# Patient Record
Sex: Female | Born: 1990 | Race: Black or African American | Hispanic: No | Marital: Single | Smoking: Current some day smoker
Health system: Southern US, Community
[De-identification: ages and names within clinical notes are randomized; demographics above are authoritative.]

## PROBLEM LIST (undated history)

## (undated) ENCOUNTER — Ambulatory Visit (HOSPITAL_COMMUNITY): Payer: Managed Care, Other (non HMO)

## (undated) DIAGNOSIS — F32A Depression, unspecified: Secondary | ICD-10-CM

## (undated) DIAGNOSIS — D369 Benign neoplasm, unspecified site: Secondary | ICD-10-CM

## (undated) DIAGNOSIS — F329 Major depressive disorder, single episode, unspecified: Secondary | ICD-10-CM

## (undated) HISTORY — PX: BREAST REDUCTION SURGERY: SHX8

## (undated) HISTORY — DX: Benign neoplasm, unspecified site: D36.9

---

## 2011-11-18 ENCOUNTER — Emergency Department (HOSPITAL_COMMUNITY)
Admission: EM | Admit: 2011-11-18 | Discharge: 2011-11-18 | Disposition: A | Discharge status: Home / Self Care | Payer: Self-pay | Attending: Emergency Medicine | Admitting: Emergency Medicine

## 2011-11-18 ENCOUNTER — Encounter (HOSPITAL_COMMUNITY): Payer: Self-pay | Admitting: *Deleted

## 2011-11-18 DIAGNOSIS — R1084 Generalized abdominal pain: Secondary | ICD-10-CM | POA: Insufficient documentation

## 2011-11-18 DIAGNOSIS — N390 Urinary tract infection, site not specified: Secondary | ICD-10-CM | POA: Insufficient documentation

## 2011-11-18 DIAGNOSIS — N72 Inflammatory disease of cervix uteri: Secondary | ICD-10-CM | POA: Insufficient documentation

## 2011-11-18 LAB — WET PREP, GENITAL: Yeast Wet Prep HPF POC: NONE SEEN

## 2011-11-18 LAB — CBC
HCT: 33.3 % — ABNORMAL LOW (ref 36.0–46.0)
MCH: 29.7 pg (ref 26.0–34.0)
MCV: 87.4 fL (ref 78.0–100.0)
RDW: 12.7 % (ref 11.5–15.5)
WBC: 8.1 10*3/uL (ref 4.0–10.5)

## 2011-11-18 LAB — URINE MICROSCOPIC-ADD ON

## 2011-11-18 LAB — URINALYSIS, ROUTINE W REFLEX MICROSCOPIC
Nitrite: NEGATIVE
Specific Gravity, Urine: 1.032 — ABNORMAL HIGH (ref 1.005–1.030)
Urobilinogen, UA: 4 mg/dL — ABNORMAL HIGH (ref 0.0–1.0)
pH: 7 (ref 5.0–8.0)

## 2011-11-18 LAB — COMPREHENSIVE METABOLIC PANEL
Albumin: 4.3 g/dL (ref 3.5–5.2)
BUN: 11 mg/dL (ref 6–23)
Calcium: 9.5 mg/dL (ref 8.4–10.5)
Chloride: 102 mEq/L (ref 96–112)
Creatinine, Ser: 0.74 mg/dL (ref 0.50–1.10)
GFR calc non Af Amer: >90 mL/min (ref >90)
Total Bilirubin: 0.2 mg/dL — ABNORMAL LOW (ref 0.3–1.2)

## 2011-11-18 LAB — PREGNANCY, URINE: Preg Test, Ur: NEGATIVE

## 2011-11-18 LAB — LIPASE, BLOOD: Lipase: 30 U/L (ref 11–59)

## 2011-11-18 MED ORDER — SODIUM CHLORIDE 0.9 % IV BOLUS (SEPSIS)
1000.0000 mL | Freq: Once | INTRAVENOUS | Status: AC
  Administered 2011-11-18: 1000 mL via INTRAVENOUS

## 2011-11-18 MED ORDER — CEFTRIAXONE SODIUM 250 MG IJ SOLR
250.0000 mg | Freq: Once | INTRAMUSCULAR | Status: AC
  Administered 2011-11-18: 250 mg via INTRAMUSCULAR
  Filled 2011-11-18: qty 250

## 2011-11-18 MED ORDER — LIDOCAINE HCL 1 % IJ SOLN
INTRAMUSCULAR | Status: AC
  Administered 2011-11-18: 1 mL
  Filled 2011-11-18: qty 20

## 2011-11-18 MED ORDER — NITROFURANTOIN MONOHYD MACRO 100 MG PO CAPS: 100.0000 mg | ORAL_CAPSULE | Freq: Two times a day (BID) | ORAL | Status: AC

## 2011-11-18 MED ORDER — AZITHROMYCIN 250 MG PO TABS
1000.0000 mg | ORAL_TABLET | Freq: Once | ORAL | Status: AC
  Administered 2011-11-18: 1000 mg via ORAL
  Filled 2011-11-18: qty 4

## 2011-11-18 NOTE — ED Notes (Addendum)
Pt in c/o intermittent abd pain over last 2 months, states she has not had a period in two months, neg preg test at home, denies n/v, denies any other symptoms

## 2011-11-18 NOTE — ED Provider Notes (Signed)
History     CSN: [id number]  Arrival date & time 11/18/11  1452   First MD Initiated Contact with Patient 11/18/11 1745      Chief Complaint  Patient presents with  . Abdominal Pain    (Consider location/radiation/quality/duration/timing/severity/associated sxs/prior treatment) The history is provided by the patient.   patient is a healthy 21 year old female who presents with a chief complaint of waxing and waning generalized abdominal pain for the last 2 months. There has been an associated amenorrhea as her last menstrual period was at the end of January (though she does not know the exact date). She is not on any form of birth control and does not use condoms with her husband, her reported monogamous sexual partner. There is some vaginal discharge, though she feels is normal for her. There is no vaginal pain or itching. Of note, the patient had a breast reduction November and did not have a period in November or December either. She denies any fever, chills, nausea, vomiting, diarrhea, constipation, dysuria, hematuria. Eating sometimes makes her symptoms worse but not always. Nothing makes her symptoms better, though she has not tried anything for the pain. She has no prior abdominal surgeries.  History reviewed. No pertinent past medical history.  History reviewed. No pertinent past surgical history.  History reviewed. No pertinent family history.  History  Substance Use Topics  . Smoking status: Not on file  . Smokeless tobacco: Not on file  . Alcohol Use: Not on file     Review of Systems 10 systems reviewed and are negative for acute change except as noted in the HPI.  Allergies  Review of patient's allergies indicates no known allergies.  Home Medications  No current outpatient prescriptions on file.  BP 118/66  Pulse 102  Temp(Src) 98.5 F (36.9 C) (Oral)  Resp 20  SpO2 100%  Physical Exam  Nursing note and vitals reviewed. Constitutional: She is oriented to  person, place, and time. She appears well-developed and well-nourished. No distress.  HENT:  Head: Normocephalic and atraumatic.  Right Ear: External ear normal.  Left Ear: External ear normal.  Mouth/Throat: Oropharynx is clear and moist.  Eyes: Conjunctivae are normal. Pupils are equal, round, and reactive to light.  Neck: Normal range of motion. Neck supple.  Cardiovascular: Normal rate, regular rhythm and normal heart sounds.   Pulmonary/Chest: Effort normal and breath sounds normal. No respiratory distress. She exhibits no tenderness.  Abdominal: Soft. Bowel sounds are normal. There is Tenderness: mild generalized pain on palpation.. There is no rebound and no guarding.       No rigidity  Genitourinary: There is no rash, tenderness or lesion on the right labia. There is no rash, tenderness or lesion on the left labia. Uterus is not enlarged and not tender. Cervix exhibits discharge and friability. Cervix exhibits no motion tenderness. Right adnexum displays no mass, no tenderness and no fullness. Left adnexum displays no mass, no tenderness and no fullness. No tenderness or bleeding around the vagina. No foreign body around the vagina. Vaginal discharge found.       Yellow vaginal discharge  Musculoskeletal: She exhibits no edema and no tenderness.  Neurological: She is alert and oriented to person, place, and time. No cranial nerve deficit.  Skin: Skin is warm and dry. No rash noted.  Psychiatric: She has a normal mood and affect.    ED Course  Procedures (including critical care time)  Labs Reviewed  URINALYSIS, ROUTINE W REFLEX MICROSCOPIC - Abnormal; Notable  for the following:    Specific Gravity, Urine 1.032 (*)    Bilirubin Urine SMALL (*)    Urobilinogen, UA 4.0 (*)    Leukocytes, UA LARGE (*)    All other components within normal limits  URINE MICROSCOPIC-ADD ON - Abnormal; Notable for the following:    Squamous Epithelial / LPF MANY (*)    Bacteria, UA MANY (*)    All  other components within normal limits  CBC - Abnormal; Notable for the following:    RBC 3.81 (*)    Hemoglobin 11.3 (*)    HCT 33.3 (*)    All other components within normal limits  COMPREHENSIVE METABOLIC PANEL - Abnormal; Notable for the following:    Glucose, Bld 118 (*)    Total Bilirubin 0.2 (*)    All other components within normal limits  WET PREP, GENITAL - Abnormal; Notable for the following:    WBC, Wet Prep HPF POC MANY (*)    All other components within normal limits  PREGNANCY, URINE  LIPASE, BLOOD  GC/CHLAMYDIA PROBE AMP, GENITAL   No results found.   1. UTI (urinary tract infection)   2. Cervicitis       MDM  Keimya 2 months of abdominal pain with a relatively benign abdomen on examination. Although there is elevated urobilinogen in her urine, her total bilirubin is not elevated on her CMP so I suspect an erroneous result in her urine. There is evidence of a likely urinary tract infection and she'll be treated for this. In addition, her wet prep demonstrates many white blood cells in the setting of a friable cervix and yellow vaginal discharge. She'll be treated for gonorrhea and chlamydia in the emergency department and I have discussed these diseases with her as well as the lesser precaution she should take. Patient voices understanding of the plan.        Shaaron Adler, [place] 11/18/11 2039

## 2011-11-18 NOTE — Discharge Instructions (Signed)
Urinary Tract Infection Infections of the urinary tract can start in several places. A bladder infection (cystitis), a kidney infection (pyelonephritis), and a prostate infection (prostatitis) are different types of urinary tract infections (UTIs). They usually get better if treated with medicines (antibiotics) that kill germs. Take all the medicine until it is gone. You or your child may feel better in a few days, but TAKE ALL MEDICINE or the infection may not respond and may become more difficult to treat. HOME CARE INSTRUCTIONS   Drink enough water and fluids to keep the urine clear or pale yellow. Cranberry juice is especially recommended, in addition to large amounts of water.   Avoid caffeine, tea, and carbonated beverages. They tend to irritate the bladder.   Alcohol may irritate the prostate.   Only take over-the-counter or prescription medicines for pain, discomfort, or fever as directed by your caregiver.  To prevent further infections:  Empty the bladder often. Avoid holding urine for long periods of time.   After a bowel movement, women should cleanse from front to back. Use each tissue only once.   Empty the bladder before and after sexual intercourse.  FINDING OUT THE RESULTS OF YOUR TEST Not all test results are available during your visit. If your or your child's test results are not back during the visit, make an appointment with your caregiver to find out the results. Do not assume everything is normal if you have not heard from your caregiver or the medical facility. It is important for you to follow up on all test results. SEEK MEDICAL CARE IF:   There is back pain.   Your baby is older than 3 months with a rectal temperature of 100.5 F (38.1 C) or higher for more than 1 day.   Your or your child's problems (symptoms) are no better in 3 days. Return sooner if you or your child is getting worse.  SEEK IMMEDIATE MEDICAL CARE IF:   There is severe back pain or lower  abdominal pain.   You or your child develops chills.   You have a fever.   Your baby is older than 3 months with a rectal temperature of 102 F (38.9 C) or higher.   Your baby is 95 months old or younger with a rectal temperature of 100.4 F (38 C) or higher.   There is nausea or vomiting.   There is continued burning or discomfort with urination.  MAKE SURE YOU:   Understand these instructions.   Will watch your condition.   Will get help right away if you are not doing well or get worse.  Document Released: 05/26/2005 Document Revised: 08/05/2011 Document Reviewed: 12/29/2006 [facility] Patient Information 2012 [place], [place].        Cervicitis Cervicitis is a soreness and swelling (inflammation) of the cervix. Your cervix is located at the bottom of your uterus which opens up to the vagina.  CAUSES   Sexually transmitted infections (STIs).   Allergic reaction.   Medicines or birth control devices that are put in the vagina.   Injury to the cervix.   Bacterial infections.  SYMPTOMS  There may be no symptoms. If symptoms occur, they may include:  Grey, white, yellow, or bad smelling vaginal discharge.   Pain or itching of the area outside the vagina.   Painful sexual intercourse.   Lower abdominal or lower back pain, especially during intercourse.   Frequent urination.   Abnormal vaginal bleeding between periods, after sexual intercourse, or after menopause.  Pressure or a heavy feeling in the pelvis.  DIAGNOSIS  Diagnosis is made after a pelvic exam. Other tests may include:  Examination of any discharge under a microscope (wet prep).   A Pap test.  TREATMENT  Treatment will depend on the cause of cervicitis. If it is caused by an STI, both you and your partner will need to be treated. Antibiotic medicines will be given. HOME CARE INSTRUCTIONS   Do not have sexual intercourse until your caregiver says it is okay.   Do not have sexual  intercourse until your partner has been treated if your cervicitis is caused by an STI.   Take your antibiotics as directed. Finish them even if you start to feel better.  SEEK IMMEDIATE MEDICAL CARE IF:   Your symptoms come back.   You have a fever.   You experience any problems that may be related to the medicine you are taking.  MAKE SURE YOU:   Understand these instructions.   Will watch your condition.   Will get help right away if you are not doing well or get worse.  Document Released: 08/16/2005 Document Revised: 08/05/2011 Document Reviewed: 03/15/2011 [facility] Patient Information 2012 [place], [place].       Free STD Testing These locations offer FREE, confidential testing for HIV, chlamydia, gonorrhea, and syphilis: [facility]: [address] [place]    [phone]    Mondays from 5pm-7pm [facility]: [address] [place], [address]    Self Help Building    [phone]    Wednesdays from 2pm-8pm [facility] and [facility]: [address]    [phone]    Thursdays from 9am-12pm and 1pm-4pm [facility]: [address]    [phone]    Tuesdays from 9am-12pm, Thursdays from 1pm-4pm   [facility] offers FREE, confidential testing and treatment for HIV, chlamydia, gonorrhea, syphilis, herpes, bacterial vaginosis, yeast, and trichomoniasis:  Call [phone] for an appointment at either location, testing is Monday through Friday at both locations  [facility]: [address] [place] [facility]: [address]

## 2011-11-20 NOTE — ED Notes (Signed)
attempted to call patient. Number is no longer in service. Will send letter.

## 2011-11-20 NOTE — ED Notes (Signed)
+  Chlamydia. Patient treated with Rocephin and Zithromax. 

## 2011-11-20 NOTE — ED Provider Notes (Signed)
History/physical exam/procedure(s) were performed by non-physician practitioner and as supervising physician I was immediately available for consultation/collaboration. I have reviewed all notes and am in agreement with care and plan.   Deasia Chiu S Xavion Muscat, MD 11/20/11 0652 

## 2012-07-04 ENCOUNTER — Encounter (HOSPITAL_COMMUNITY): Payer: Self-pay | Admitting: Physical Medicine and Rehabilitation

## 2012-07-04 ENCOUNTER — Emergency Department (HOSPITAL_COMMUNITY)
Admission: EM | Admit: 2012-07-04 | Discharge: 2012-07-05 | Disposition: A | Discharge status: Home / Self Care | Payer: Self-pay | Attending: Emergency Medicine | Admitting: Emergency Medicine

## 2012-07-04 DIAGNOSIS — N939 Abnormal uterine and vaginal bleeding, unspecified: Secondary | ICD-10-CM

## 2012-07-04 DIAGNOSIS — R109 Unspecified abdominal pain: Secondary | ICD-10-CM | POA: Insufficient documentation

## 2012-07-04 DIAGNOSIS — O9989 Other specified diseases and conditions complicating pregnancy, childbirth and the puerperium: Secondary | ICD-10-CM | POA: Insufficient documentation

## 2012-07-04 DIAGNOSIS — N898 Other specified noninflammatory disorders of vagina: Secondary | ICD-10-CM

## 2012-07-04 DIAGNOSIS — Z3490 Encounter for supervision of normal pregnancy, unspecified, unspecified trimester: Secondary | ICD-10-CM

## 2012-07-04 DIAGNOSIS — O209 Hemorrhage in early pregnancy, unspecified: Secondary | ICD-10-CM | POA: Insufficient documentation

## 2012-07-04 LAB — CBC
MCHC: 33.6 g/dL (ref 30.0–36.0)
RDW: 12.8 % (ref 11.5–15.5)

## 2012-07-04 LAB — URINE MICROSCOPIC-ADD ON

## 2012-07-04 LAB — WET PREP, GENITAL
Trich, Wet Prep: NONE SEEN
Yeast Wet Prep HPF POC: NONE SEEN

## 2012-07-04 LAB — URINALYSIS, ROUTINE W REFLEX MICROSCOPIC
Glucose, UA: NEGATIVE mg/dL
Hgb urine dipstick: NEGATIVE
Specific Gravity, Urine: 1.036 — ABNORMAL HIGH (ref 1.005–1.030)

## 2012-07-04 LAB — TYPE AND SCREEN
ABO/RH(D): O POS
Antibody Screen: NEGATIVE

## 2012-07-04 LAB — POCT PREGNANCY, URINE: Preg Test, Ur: POSITIVE — AB

## 2012-07-04 MED ORDER — CEFTRIAXONE SODIUM 250 MG IJ SOLR
250.0000 mg | Freq: Once | INTRAMUSCULAR | Status: AC
  Administered 2012-07-04: 250 mg via INTRAMUSCULAR
  Filled 2012-07-04: qty 250

## 2012-07-04 MED ORDER — AZITHROMYCIN 1 G PO PACK
1.0000 g | PACK | Freq: Once | ORAL | Status: AC
  Administered 2012-07-04: 1 g via ORAL
  Filled 2012-07-04: qty 1

## 2012-07-04 NOTE — ED Notes (Signed)
Pt presents to department for evaluation of vaginal bleeding. State she had positive pregnancy test last week. LMP: 03/09/12. 5/10 lower abdominal pain at the time. Also states nausea. States she has not received prenatal care. She is conscious alert and oriented x4.

## 2012-07-04 NOTE — ED Provider Notes (Signed)
History     CSN: [id number]  Arrival date & time 07/04/12  1533   First MD Initiated Contact with Patient 07/04/12 2015      Chief Complaint  Patient presents with  . Vaginal Bleeding    (Consider location/radiation/quality/duration/timing/severity/associated sxs/prior treatment) HPIAngela Teagle is a 21 y.o. female who is presenting for evaluation of vaginal bleeding. Complaint was that she had some small amount of vaginal bleeding this morning and has not returned. She's had some left-sided abdominal pain this feels like a moderate sharp and a pressure type pain, she also is having some frequency but not like a urinary tract infection. She's had no dysuria or urgency. She knows she is pregnant. Last menstrual period was 710 or 03/09/2012. She's been having some nausea in a car.  Patient says her "Medicaid he is out" so she's not had any prenatal care so far is not taking any vitamins.  No past medical history on file.  No past surgical history on file.  No family history on file.  History  Substance Use Topics  . Smoking status: Never Smoker   . Smokeless tobacco: Not on file  . Alcohol Use: No    OB History    Grav Para Term Preterm Abortions TAB SAB Ect Mult Living                  Review of Systems At least 10pt or greater review of systems completed and are negative except where specified in the HPI.  Allergies  Review of patient's allergies indicates no known allergies.  Home Medications  No current outpatient prescriptions on file.  BP 96/60  Pulse 73  Temp 98.4 F (36.9 C) (Oral)  Resp 18  SpO2 99%  Physical Exam  Nursing notes reviewed.  Electronic medical record reviewed. VITAL SIGNS:   Filed Vitals:   07/04/12 1551 07/04/12 2027 07/04/12 2257  BP: 100/62 96/60 94/57   Pulse: 83 73 77  Temp: 98.5 F (36.9 C) 98.4 F (36.9 C) 98.2 F (36.8 C)  TempSrc: Oral Oral Oral  Resp: 18 18 20   SpO2: 99% 99% 100%   CONSTITUTIONAL: Awake, oriented,  appears non-toxic HENT: Atraumatic, normocephalic, oral mucosa pink and moist, airway patent. Nares patent without drainage. External ears normal. EYES: Conjunctiva clear, EOMI, PERRLA NECK: Trachea midline, non-tender, supple CARDIOVASCULAR: Normal heart rate, Normal rhythm, No murmurs, rubs, gallops PULMONARY/CHEST: Clear to auscultation, no rhonchi, wheezes, or rales. Symmetrical breath sounds. Non-tender. ABDOMINAL: Non-distended, soft, non-tender - no rebound or guarding. Gravid uterus approximately 3 cm beneath the umbilicus.  BS normal. NEUROLOGIC: Non-focal, moving all four extremities, no gross sensory or motor deficits. EXTREMITIES: No clubbing, cyanosis, or edema SKIN: Warm, Dry, No erythema, No rash Pelvic exam: normal external genitalia, vulva, vagina has moderated quantity of green purulent mucoid material in vault, cervix red and friable - os closed, no blood seen, uterus and adnexa palpate as gravid w/o CMT   ED Course  Procedures (including critical care time)  Labs Reviewed  URINALYSIS, ROUTINE W REFLEX MICROSCOPIC - Abnormal; Notable for the following:    Color, Urine AMBER (*)  BIOCHEMICALS MAY BE AFFECTED BY COLOR   APPearance CLOUDY (*)     Specific Gravity, Urine 1.036 (*)     Bilirubin Urine SMALL (*)     Ketones, ur 15 (*)     Urobilinogen, UA 4.0 (*)     Leukocytes, UA SMALL (*)     All other components within normal limits  POCT PREGNANCY, URINE -  Abnormal; Notable for the following:    Preg Test, Ur POSITIVE (*)     All other components within normal limits  URINE MICROSCOPIC-ADD ON - Abnormal; Notable for the following:    Squamous Epithelial / LPF MANY (*)     Bacteria, UA FEW (*)     All other components within normal limits  URINE CULTURE  WET PREP, GENITAL  GC/CHLAMYDIA PROBE AMP, URINE   No results found. No results found.    1. Purulent vaginal discharge   2. Pregnancy   3. Abdominal pain   4. Vaginal bleeding       MDM  Laelle Bridgett is a 21 y.o. female presenting for lower abdominal pain, known pregnancy. Pregnancy is 16 and 6 by last menstrual period. She had an isolated incident of bleeding this morning, this is gone, and did not recur. Pelvic exam shows a large amount of purulence material in the vaginal vault - consistent with PID.  We'll treat the patient. Patient says she's had this before and prior pregnancies and was considered "normal." Based on the color and amount of this discharge, do not think this is normal I think he does reflect a cervicitis/STI especially with some cervical friability consistent with cervicitis.  My bedside ultrasound shows a healthy intrauterine pregnancy heart rate approximately 145.    Treat patient for STI/cervicitis, Tylenol for pain medicine. The bleeding was likely caused by the friable cervix.  Advised OB/GYN followup at the [facility], and that the husband should get checked as well for STI.  I explained the diagnosis and have given explicit precautions to return to the ER including worsening abdominal pain, bright red blood per vagina or any other new or worsening symptoms. The patient understands and accepts the medical plan as it's been dictated and I have answered their questions. Discharge instructions concerning home care and prescriptions have been given.  The patient is STABLE and is discharged to home in good condition.          Jones Skene, MD 07/07/12 1541

## 2012-07-05 LAB — URINE CULTURE

## 2012-07-07 LAB — GC/CHLAMYDIA PROBE AMP
CT Probe RNA: POSITIVE — AB
GC Probe RNA: NEGATIVE

## 2012-09-23 ENCOUNTER — Encounter (HOSPITAL_COMMUNITY): Payer: Self-pay | Admitting: Emergency Medicine

## 2012-09-23 ENCOUNTER — Emergency Department (HOSPITAL_COMMUNITY)
Admission: EM | Admit: 2012-09-23 | Discharge: 2012-09-24 | Disposition: A | Discharge status: Home / Self Care | Payer: Medicaid Other | Attending: Emergency Medicine | Admitting: Emergency Medicine

## 2012-09-23 DIAGNOSIS — O9989 Other specified diseases and conditions complicating pregnancy, childbirth and the puerperium: Secondary | ICD-10-CM | POA: Insufficient documentation

## 2012-09-23 DIAGNOSIS — Z8742 Personal history of other diseases of the female genital tract: Secondary | ICD-10-CM

## 2012-09-23 DIAGNOSIS — Z3490 Encounter for supervision of normal pregnancy, unspecified, unspecified trimester: Secondary | ICD-10-CM

## 2012-09-23 DIAGNOSIS — N949 Unspecified condition associated with female genital organs and menstrual cycle: Secondary | ICD-10-CM | POA: Insufficient documentation

## 2012-09-23 DIAGNOSIS — R109 Unspecified abdominal pain: Secondary | ICD-10-CM

## 2012-09-23 DIAGNOSIS — O4690 Antepartum hemorrhage, unspecified, unspecified trimester: Secondary | ICD-10-CM | POA: Insufficient documentation

## 2012-09-23 LAB — WET PREP, GENITAL
Trich, Wet Prep: NONE SEEN
Yeast Wet Prep HPF POC: NONE SEEN

## 2012-09-23 LAB — CBC WITH DIFFERENTIAL/PLATELET
Basophils Absolute: 0 10*3/uL (ref 0.0–0.1)
Eosinophils Absolute: 0.1 10*3/uL (ref 0.0–0.7)
Eosinophils Relative: 1 % (ref 0–5)
HCT: 27.5 % — ABNORMAL LOW (ref 36.0–46.0)
Lymphocytes Relative: 20 % (ref 12–46)
MCH: 30 pg (ref 26.0–34.0)
MCV: 87.9 fL (ref 78.0–100.0)
Monocytes Absolute: 0.7 10*3/uL (ref 0.1–1.0)
RDW: 12.8 % (ref 11.5–15.5)
WBC: 7.7 10*3/uL (ref 4.0–10.5)

## 2012-09-23 NOTE — ED Notes (Signed)
[facility]  and [facility] is not having any contractions and heartrate of150 for baby

## 2012-09-23 NOTE — Progress Notes (Signed)
Received call from [facility] Emergency Department RN with patient complaining of abdominal pain and vaginal bleeding.

## 2012-09-23 NOTE — ED Provider Notes (Signed)
History  This chart was scribed for Kim Skene, MD by Ardeen Jourdain, ED Scribe. This patient was seen in room APA01/APA01 and the patient's care was started at 2256.  CSN: [id number]  Arrival date & time 09/23/12  2225   First MD Initiated Contact with Patient 09/23/12 2256      Chief Complaint  Patient presents with  . Abdominal Pain     The history is provided by the patient. No language interpreter was used.    Kim Zimmerman is a 22 y.o. female G3P2 who presents to the Emergency Department complaining of abdominal pain that began 1 month ago and has been gradually worsening with associated vaginal bleeding over the past 2 days. She states the vaginal bleeding began yesterday she soiled 1 pad yesterday, and has has minimal bleeding today.  She describes the blood "like period blood."  She describes the pain as a "stetching and cramping feeling." She denies any contractions, rush of fluid.  She has had an increase in vaginal discharge. She reports the cramping is aggravated by position and movement. She states she has to sleep with a pillow between her legs. She denies any having medical attention for her pregnancy.  She denies taking anything for the pain. She denies any sexual activity outside the marriage.   History reviewed. No pertinent past medical history.  Past Surgical History  Procedure Date  . Breast reduction surgery     History reviewed. No pertinent family history.  History  Substance Use Topics  . Smoking status: Never Smoker   . Smokeless tobacco: Not on file  . Alcohol Use: No    OB History    Grav Para Term Preterm Abortions TAB SAB Ect Mult Living   1               Review of Systems At least 10pt or greater review of systems completed and are negative except where specified in the HPI.  Allergies  Review of patient's allergies indicates no known allergies.  Home Medications  No current outpatient prescriptions on file.  Triage Vitals: BP  120/53  Pulse 86  Temp 98 F (36.7 C) (Oral)  Resp 18  Ht 5\' 4"  (1.626 m)  SpO2 98%  LMP 03/15/2012  Physical Exam  Nursing notes reviewed.  Electronic medical record reviewed. VITAL SIGNS:   Filed Vitals:   09/23/12 2240 09/24/12 0133  BP: 120/53 100/60  Pulse: 86 86  Temp: 98 F (36.7 C) 98.6 F (37 C)  TempSrc: Oral Oral  Resp: 18 20  Height: 5\' 4"  (1.626 m)   SpO2: 98%    CONSTITUTIONAL: Awake, oriented, appears non-toxic HENT: Atraumatic, normocephalic, oral mucosa pink and moist, airway patent. Nares patent without drainage. External ears normal. EYES: Conjunctiva clear, EOMI, PERRLA NECK: Trachea midline, non-tender, supple CARDIOVASCULAR: Normal heart rate, Normal rhythm, No murmurs, rubs, gallops PULMONARY/CHEST: Clear to auscultation, no rhonchi, wheezes, or rales. Symmetrical breath sounds. Non-tender. ABDOMINAL: Gravida fundus approximately 6 cm above the umbilicus, soft, non-tender - no rebound or guarding.  BS normal. NEUROLOGIC: Non-focal, moving all four extremities, no gross sensory or motor deficits. EXTREMITIES: No clubbing, cyanosis, or edema SKIN: Warm, Dry, No erythema, No rash  ED Course  [place] bedside Date/Time: 09/23/2012 11:14 PM Performed by: Ardeen Jourdain Authorized by: Kim Zimmerman Comments: Fetus with good movement appears to be a female, good heart rate about 150-160   (including critical care time)  DIAGNOSTIC STUDIES: Oxygen Saturation is 98% on room air, normal by my  interpretation.    COORDINATION OF CARE:  11:04 PM: Discussed treatment plan which includes bedside US and IV fluids with pt at bedside and pt agreed to plan.    Labs Reviewed  WET PREP, GENITAL - Abnormal; Notable for the following:    Clue Cells Wet Prep HPF POC FEW (*)     WBC, Wet Prep HPF POC TOO NUMEROUS TO COUNT (*)     All other components within normal limits  CBC WITH DIFFERENTIAL - Abnormal; Notable for the following:    RBC 3.13 (*)     Hemoglobin  9.4 (*)     HCT 27.5 (*)     All other components within normal limits  COMPREHENSIVE METABOLIC PANEL - Abnormal; Notable for the following:    Sodium 130 (*)     Potassium 3.4 (*)     Albumin 3.0 (*)     Alkaline Phosphatase 135 (*)     Total Bilirubin 0.1 (*)     All other components within normal limits  URINALYSIS, ROUTINE W REFLEX MICROSCOPIC - Abnormal; Notable for the following:    Specific Gravity, Urine >1.030 (*)     Ketones, ur TRACE (*)     Urobilinogen, UA 4.0 (*)     All other components within normal limits  GC/CHLAMYDIA PROBE AMP   No results found.   1. Normal pregnancy   2. Abdominal pain   3. Round ligament pain   4. H/O vaginal bleeding       MDM  Kim Zimmerman is a 22 y.o. female presents at 60 3 and [redacted] weeks gestation from her third pregnancy. Patient has not had any prenatal care secondary to insurance issues. Patient presents with 2 days history of scant vaginal bleeding with some pulling tightness that was suggestive of round ligament pain. More concerning is where the bleeding is coming from however it seems to be contained on physical exam there is no bleeding, no blood coming from the os. There is increased discharge - she denies any sexual activity outside the marriage, and will not treat for PID at this time. Patient is mildly hyponatremic, patient may just eat and drink normally do not think she needs to be treated with IV fluids at this time.  Discussed this with the obstetrician on call Dr. Jolayne Panther, who agrees with this assessment this is unlikely something that we need to address emergently, she likely does not transported emergently, she is currently pain-free, there is no current bleeding. The baby has been on the monitor for approximately 2 hours and monitored by the [facility], she's had a good heart rate good fetal variability, there's been no contractions seen on tocometer. Labs otherwise unremarkable.  Patient was discharged to home in good  condition. Get followup at the [facility] - Monday.  I explained the diagnosis and have given explicit precautions to return to the [facility] - or nearest ER for including rush of fluid, increased bleeding or any other new or worsening symptoms. The patient understands and accepts the medical plan as it's been dictated and I have answered their questions. Discharge instructions concerning home care and prescriptions have been given.  The patient is STABLE and is discharged to home in good condition.   I personally performed the services described in this documentation, which was scribed in my presence. The recorded information has been reviewed and is accurate. Kim Zimmerman, M.D.   }  Kim Skene, MD 09/24/12 [phone]

## 2012-09-23 NOTE — ED Notes (Addendum)
Patient complaining of lower abdominal pain, lower back pain, and vaginal pain. Reports pain has been going on for approximately a month. Reports LMP being in July and that she knows she is pregnant. No prenatal care or ultrasound since becoming pregnant. Also reports small amount of bleeding that started yesterday.

## 2012-09-24 LAB — URINALYSIS, ROUTINE W REFLEX MICROSCOPIC
Nitrite: NEGATIVE
Specific Gravity, Urine: >1.03 — ABNORMAL HIGH (ref 1.005–1.030)
Urobilinogen, UA: 4 mg/dL — ABNORMAL HIGH (ref 0.0–1.0)

## 2012-09-24 LAB — COMPREHENSIVE METABOLIC PANEL
AST: 11 U/L (ref 0–37)
CO2: 21 mEq/L (ref 19–32)
Calcium: 8.8 mg/dL (ref 8.4–10.5)
Creatinine, Ser: 0.54 mg/dL (ref 0.50–1.10)
GFR calc Af Amer: >90 mL/min (ref >90)
GFR calc non Af Amer: >90 mL/min (ref >90)
Glucose, Bld: 87 mg/dL (ref 70–99)
Total Protein: 6.6 g/dL (ref 6.0–8.3)

## 2012-09-24 MED ORDER — ACETAMINOPHEN 325 MG PO TABS
650.0000 mg | ORAL_TABLET | Freq: Once | ORAL | Status: AC
  Administered 2012-09-24: 650 mg via ORAL
  Filled 2012-09-24: qty 2

## 2012-09-24 NOTE — Progress Notes (Signed)
Called [facility] Emergency Department and spoke with RN and patient discharged home

## 2012-09-25 LAB — OCCULT BLOOD, POC DEVICE: Fecal Occult Bld: NEGATIVE

## 2012-09-29 NOTE — ED Notes (Signed)
Chart returned from EDP office  with  rx written by Lemont Fillers for Azithromycin 250 mg tab # 4 take 4 tab (1G) po at one time. Attempt made to contact pt via phone. Wrong EPIC number-letter sent to EPIC address.

## 2012-10-02 ENCOUNTER — Encounter: Payer: Self-pay | Admitting: Obstetrics and Gynecology

## 2012-10-23 ENCOUNTER — Encounter: Payer: Self-pay | Admitting: Obstetrics and Gynecology

## 2012-11-19 ENCOUNTER — Telehealth (HOSPITAL_COMMUNITY): Payer: Self-pay | Admitting: Emergency Medicine

## 2012-11-19 NOTE — ED Notes (Signed)
No response to letter sent after 30 days. Chart sent to Medical Records. °

## 2013-08-17 ENCOUNTER — Emergency Department (HOSPITAL_COMMUNITY): Payer: Medicaid Other

## 2013-08-17 ENCOUNTER — Encounter (HOSPITAL_COMMUNITY): Payer: Self-pay | Admitting: Emergency Medicine

## 2013-08-17 ENCOUNTER — Emergency Department (HOSPITAL_COMMUNITY)
Admission: EM | Admit: 2013-08-17 | Discharge: 2013-08-17 | Disposition: A | Discharge status: Home / Self Care | Payer: Medicaid Other | Attending: Emergency Medicine | Admitting: Emergency Medicine

## 2013-08-17 DIAGNOSIS — R5381 Other malaise: Secondary | ICD-10-CM | POA: Insufficient documentation

## 2013-08-17 DIAGNOSIS — Z3202 Encounter for pregnancy test, result negative: Secondary | ICD-10-CM | POA: Insufficient documentation

## 2013-08-17 DIAGNOSIS — S060X9A Concussion with loss of consciousness of unspecified duration, initial encounter: Secondary | ICD-10-CM

## 2013-08-17 DIAGNOSIS — R110 Nausea: Secondary | ICD-10-CM | POA: Insufficient documentation

## 2013-08-17 LAB — URINALYSIS, ROUTINE W REFLEX MICROSCOPIC
Bilirubin Urine: NEGATIVE
Hgb urine dipstick: NEGATIVE

## 2013-08-17 LAB — POCT PREGNANCY, URINE: Preg Test, Ur: NEGATIVE

## 2013-08-17 LAB — CG4 I-STAT (LACTIC ACID): Lactic Acid, Venous: 0.51 mmol/L (ref 0.5–2.2)
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
## 2013-08-20 LAB — CG4 I-STAT (LACTIC ACID): Lactic Acid, Venous: 0.47 mmol/L — ABNORMAL LOW (ref 0.5–2.2)

## 2014-07-01 ENCOUNTER — Encounter (HOSPITAL_COMMUNITY): Payer: Self-pay | Admitting: Emergency Medicine

## 2014-08-01 ENCOUNTER — Emergency Department (HOSPITAL_COMMUNITY)
Admission: EM | Admit: 2014-08-01 | Discharge: 2014-08-01 | Disposition: A | Discharge status: Home / Self Care | Payer: Medicaid Other | Attending: Emergency Medicine | Admitting: Emergency Medicine

## 2014-08-01 ENCOUNTER — Encounter (HOSPITAL_COMMUNITY): Payer: Self-pay | Admitting: Emergency Medicine

## 2014-08-01 DIAGNOSIS — Y998 Other external cause status: Secondary | ICD-10-CM | POA: Insufficient documentation

## 2014-08-01 DIAGNOSIS — Y9389 Activity, other specified: Secondary | ICD-10-CM | POA: Insufficient documentation

## 2014-08-01 DIAGNOSIS — Y92410 Unspecified street and highway as the place of occurrence of the external cause: Secondary | ICD-10-CM | POA: Insufficient documentation

## 2014-08-01 DIAGNOSIS — Z720 Tobacco use: Secondary | ICD-10-CM | POA: Insufficient documentation

## 2014-08-01 DIAGNOSIS — S161XXA Strain of muscle, fascia and tendon at neck level, initial encounter: Secondary | ICD-10-CM | POA: Insufficient documentation

## 2014-08-01 MED ORDER — NAPROXEN 500 MG PO TABS: 500.0000 mg | ORAL_TABLET | Freq: Two times a day (BID) | ORAL | Status: DC

## 2014-08-01 MED ORDER — CYCLOBENZAPRINE HCL 5 MG PO TABS: 5.0000 mg | ORAL_TABLET | Freq: Three times a day (TID) | ORAL | Status: DC | PRN
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
## 2014-08-01 NOTE — Discharge Instructions (Signed)

## 2014-08-01 NOTE — ED Notes (Signed)
PT states she was prescribed Lortab (10 tabs) and took one yesterday with no relief.

## 2016-03-05 ENCOUNTER — Emergency Department (HOSPITAL_COMMUNITY)
Admission: EM | Admit: 2016-03-05 | Discharge: 2016-03-06 | Disposition: A | Discharge status: Home / Self Care | Payer: No Typology Code available for payment source | Attending: Emergency Medicine | Admitting: Emergency Medicine

## 2016-03-05 ENCOUNTER — Encounter (HOSPITAL_COMMUNITY): Payer: Self-pay | Admitting: *Deleted

## 2016-03-05 DIAGNOSIS — R102 Pelvic and perineal pain: Secondary | ICD-10-CM | POA: Insufficient documentation

## 2016-03-05 DIAGNOSIS — Z87891 Personal history of nicotine dependence: Secondary | ICD-10-CM | POA: Insufficient documentation

## 2016-03-05 DIAGNOSIS — M545 Low back pain: Secondary | ICD-10-CM | POA: Insufficient documentation

## 2016-03-05 DIAGNOSIS — N760 Acute vaginitis: Secondary | ICD-10-CM | POA: Insufficient documentation

## 2016-03-05 DIAGNOSIS — B9689 Other specified bacterial agents as the cause of diseases classified elsewhere: Secondary | ICD-10-CM

## 2016-03-05 LAB — COMPREHENSIVE METABOLIC PANEL
ALK PHOS: 70 U/L (ref 38–126)
ALT: 13 U/L — AB (ref 14–54)
AST: 14 U/L — AB (ref 15–41)
Albumin: 4 g/dL (ref 3.5–5.0)
Anion gap: 4 — ABNORMAL LOW (ref 5–15)
BILIRUBIN TOTAL: 0.6 mg/dL (ref 0.3–1.2)
BUN: 9 mg/dL (ref 6–20)
CO2: 25 mmol/L (ref 22–32)
CREATININE: 0.82 mg/dL (ref 0.44–1.00)
Calcium: 8.6 mg/dL — ABNORMAL LOW (ref 8.9–10.3)
Chloride: 106 mmol/L (ref 101–111)
GFR calc Af Amer: >60 mL/min (ref >60)
GLUCOSE: 99 mg/dL (ref 65–99)
Potassium: 3.6 mmol/L (ref 3.5–5.1)
Sodium: 135 mmol/L (ref 135–145)
TOTAL PROTEIN: 6.9 g/dL (ref 6.5–8.1)

## 2016-03-05 LAB — URINALYSIS, ROUTINE W REFLEX MICROSCOPIC
Bilirubin Urine: NEGATIVE
Glucose, UA: NEGATIVE mg/dL
HGB URINE DIPSTICK: NEGATIVE
Ketones, ur: NEGATIVE mg/dL
Nitrite: NEGATIVE
Protein, ur: NEGATIVE mg/dL
SPECIFIC GRAVITY, URINE: 1.025 (ref 1.005–1.030)
pH: 6 (ref 5.0–8.0)

## 2016-03-05 LAB — CBC WITH DIFFERENTIAL/PLATELET
BASOS ABS: 0 10*3/uL (ref 0.0–0.1)
Basophils Relative: 0 %
Eosinophils Absolute: 0.1 10*3/uL (ref 0.0–0.7)
Eosinophils Relative: 1 %
HEMATOCRIT: 31.1 % — AB (ref 36.0–46.0)
HEMOGLOBIN: 10.4 g/dL — AB (ref 12.0–15.0)
LYMPHS PCT: 43 %
Lymphs Abs: 2.8 10*3/uL (ref 0.7–4.0)
MCH: 30.2 pg (ref 26.0–34.0)
MCHC: 33.4 g/dL (ref 30.0–36.0)
MCV: 90.4 fL (ref 78.0–100.0)
MONO ABS: 0.4 10*3/uL (ref 0.1–1.0)
Monocytes Relative: 5 %
NEUTROS ABS: 3.3 10*3/uL (ref 1.7–7.7)
NEUTROS PCT: 50 %
Platelets: 282 10*3/uL (ref 150–400)
RBC: 3.44 MIL/uL — AB (ref 3.87–5.11)
RDW: 12.2 % (ref 11.5–15.5)
WBC: 6.6 10*3/uL (ref 4.0–10.5)

## 2016-03-05 LAB — URINE MICROSCOPIC-ADD ON: RBC / HPF: NONE SEEN RBC/hpf (ref 0–5)

## 2016-03-05 LAB — WET PREP, GENITAL
SPERM: NONE SEEN
TRICH WET PREP: NONE SEEN
Yeast Wet Prep HPF POC: NONE SEEN

## 2016-03-05 LAB — PREGNANCY, URINE: PREG TEST UR: NEGATIVE

## 2016-03-05 LAB — LIPASE, BLOOD: LIPASE: 18 U/L (ref 11–51)

## 2016-03-05 NOTE — ED Notes (Signed)
Pt reports rlq pain since Wednesday. Pt denies n/v/d. Pt reports this pain has been going on "for months" but has been worse since Wednesday. Pt states sometimes her rlq feels swollen/bloated.

## 2016-03-05 NOTE — ED Provider Notes (Signed)
CSN: [id number]     Arrival date & time 03/05/16  2131 History  By signing my name below, I, Dora Sims, attest that this documentation has been prepared under the direction and in the presence of non-physician practitioner, Evalee Jefferson, PA-C. Electronically Signed: Dora Sims, Scribe. 03/05/2016. 9:51 PM.    Chief Complaint  Patient presents with  . Abdominal Pain    The history is provided by the patient. No language interpreter was used.     HPI Comments: Kim Zimmerman is a 25 y.o. female who presents to the Emergency Department complaining of ongoing, intermittent, worsening, cramping, 7/10, RLQ abdominal pain and swelling for the last three months. She reports that her symptoms are intermittent, but can sometimes last as long as a day. Pt describes her abdominal swelling as a pressure-like sensation. She endorses exacerbation of her abdominal pain and swelling with laying flat, sitting upright, and wearing tight clothing. She has tried various OTC medications with no relief of her symptoms. She endorses associated, intermittent, burning lower back pain when her abdominal pain is most severe.  She reports that she sometimes has to unbutton her pants due to significant bloating in her RLQ. Pt's LNMP was 2 weeks ago and normal. Before today, her last episode of abdominal pain and swelling was yesterday. She does not use any medications regularly.  She denies h/o abdominal surgery. Pt further denies fever, nausea, rash, dysuria, hematuria, constipation, groin pain/swelling, vaginal discharge, or any other associated symptoms.  Pt is G3P3, not currently sexually active.    PCP none  History reviewed. No pertinent past medical history. Past Surgical History  Procedure Laterality Date  . Breast reduction surgery     History reviewed. No pertinent family history. Social History  Substance Use Topics  . Smoking status: Former Smoker -- 0.50 packs/day    Types: Cigarettes    Quit date:  09/05/2014  . Smokeless tobacco: None  . Alcohol Use: No   OB History    Gravida Para Term Preterm AB TAB SAB Ectopic Multiple Living   1         3     Review of Systems  Constitutional: Negative for fever and chills.  Gastrointestinal: Positive for abdominal pain (RLQ). Negative for nausea and constipation.       Positive for abdominal swelling (RLQ).   Genitourinary: Negative for dysuria, hematuria and vaginal discharge.  Musculoskeletal: Positive for back pain (lower, intermittent).  Skin: Negative for rash.  All other systems reviewed and are negative.   Allergies  Review of patient's allergies indicates no known allergies.  Home Medications   Prior to Admission medications   Medication Sig Start Date End Date Taking? Authorizing Provider  metroNIDAZOLE (FLAGYL) 500 MG tablet Take 1 tablet (500 mg total) by mouth 2 (two) times daily. 03/06/16   Evalee Jefferson, PA-C  naproxen (NAPROSYN) 500 MG tablet Take 1 tablet (500 mg total) by mouth 2 (two) times daily as needed for moderate pain. 03/06/16   Evalee Jefferson, PA-C   BP 107/59 mmHg  Pulse 62  Temp(Src) 99.2 F (37.3 C) (Oral)  Resp 18  Ht 5\' 4"  (1.626 m)  Wt 68.04 kg  BMI 25.73 kg/m2  SpO2 100%  LMP 02/22/2016 Physical Exam  Constitutional: She is oriented to person, place, and time. She appears well-developed and well-nourished. No distress.  HENT:  Head: Normocephalic and atraumatic.  Eyes: Conjunctivae and EOM are normal.  Neck: Neck supple. No tracheal deviation present.  Cardiovascular: Normal rate.  Pulmonary/Chest: Effort normal. No respiratory distress.  Abdominal: Normal appearance. There is tenderness in the right lower quadrant and suprapubic area. There is no rigidity, no rebound, no guarding, no CVA tenderness and no tenderness at McBurney's point.  Genitourinary: Uterus is enlarged and tender. Cervix exhibits no motion tenderness. Right adnexum displays no mass, no tenderness and no fullness. Left adnexum  displays no mass, no tenderness and no fullness.  Friable cervix.  Scant Walthers vaginal dc.  Fundus palpable approx 10-12 cm above pubis with mild tenderness.  No cervical motion tenderness.  Musculoskeletal: Normal range of motion.  Neurological: She is alert and oriented to person, place, and time.  Skin: Skin is warm and dry.  Psychiatric: She has a normal mood and affect. Her behavior is normal.  Nursing note and vitals reviewed.   ED Course  Procedures (including critical care time)  DIAGNOSTIC STUDIES: Oxygen Saturation is 100% on RA, normal by my interpretation.    COORDINATION OF CARE: 9:53 PM Discussed treatment plan with pt at bedside and pt agreed to plan.  Labs Review Labs Reviewed  WET PREP, GENITAL - Abnormal; Notable for the following:    Clue Cells Wet Prep HPF POC PRESENT (*)    WBC, Wet Prep HPF POC MANY (*)    All other components within normal limits  URINALYSIS, ROUTINE W REFLEX MICROSCOPIC (NOT AT [facility]) - Abnormal; Notable for the following:    APPearance HAZY (*)    Leukocytes, UA LARGE (*)    All other components within normal limits  CBC WITH DIFFERENTIAL/PLATELET - Abnormal; Notable for the following:    RBC 3.44 (*)    Hemoglobin 10.4 (*)    HCT 31.1 (*)    All other components within normal limits  COMPREHENSIVE METABOLIC PANEL - Abnormal; Notable for the following:    Calcium 8.6 (*)    AST 14 (*)    ALT 13 (*)    Anion gap 4 (*)    All other components within normal limits  URINE MICROSCOPIC-ADD ON - Abnormal; Notable for the following:    Squamous Epithelial / LPF 6-30 (*)    Bacteria, UA MANY (*)    All other components within normal limits  URINE CULTURE  PREGNANCY, URINE  LIPASE, BLOOD  GC/CHLAMYDIA PROBE AMP ([place]) NOT AT [facility]    Imaging Review No results found. I have personally reviewed and evaluated these images and lab results as part of my medical decision-making.   EKG Interpretation None      MDM   Final  diagnoses:  Pelvic pain in female  Bacterial vaginosis    Pt with now chronic low abd/pelvic pain with uterine fullness and ttp on exam.  Labs and cultures pending. Given chronic nature of sx, no nausea, vomiting, fevers, doubt PID, wbc count negative. Pt is scheduled for an outpatient ultrasound tomorrow to assess for uterine pathology.  Possible fibroid, although no h/o dysmenorrhea.  She does have bacteruria but without urinary tx sx and not a clean catch specimen.  Discussed tx with abx but pt opts to wait for cx results. Clue cells present, will tx for bv while waiting for other cx results.  Pt will need gyn referral if US shows pathology.  I personally performed the services described in this documentation, which was scribed in my presence. The recorded information has been reviewed and is accurate.    Evalee Jefferson, PA-C 03/06/16 0301  Merrily Pew, MD 03/07/16 [id number]

## 2016-03-06 ENCOUNTER — Ambulatory Visit (HOSPITAL_COMMUNITY): Admission: RE | Admit: 2016-03-06 | Payer: No Typology Code available for payment source | Source: Ambulatory Visit

## 2016-03-06 ENCOUNTER — Other Ambulatory Visit (HOSPITAL_COMMUNITY): Payer: Self-pay

## 2016-03-06 ENCOUNTER — Ambulatory Visit (HOSPITAL_COMMUNITY)
Admission: RE | Admit: 2016-03-06 | Discharge: 2016-03-06 | Disposition: A | Discharge status: Home / Self Care | Payer: Self-pay | Source: Ambulatory Visit | Attending: Emergency Medicine | Admitting: Emergency Medicine

## 2016-03-06 ENCOUNTER — Other Ambulatory Visit (HOSPITAL_COMMUNITY): Payer: Self-pay | Admitting: Emergency Medicine

## 2016-03-06 DIAGNOSIS — R1031 Right lower quadrant pain: Secondary | ICD-10-CM

## 2016-03-06 DIAGNOSIS — R102 Pelvic and perineal pain: Secondary | ICD-10-CM | POA: Insufficient documentation

## 2016-03-06 MED ORDER — NAPROXEN 500 MG PO TABS: 500.0000 mg | ORAL_TABLET | Freq: Two times a day (BID) | ORAL | Status: DC | PRN

## 2016-03-06 MED ORDER — METRONIDAZOLE 500 MG PO TABS: 500.0000 mg | ORAL_TABLET | Freq: Two times a day (BID) | ORAL | Status: DC

## 2016-03-06 NOTE — ED Notes (Signed)
Patient was Kim Zimmerman. Did not wait for results. At Dr Raechel Chute request, message left for patient at [phone] that was listed number to return call. No further details left on message.

## 2016-03-06 NOTE — ED Notes (Signed)
Pt alert & oriented x4, stable gait. Patient given discharge instructions, paperwork & prescription(s). Patient  instructed to stop at the registration desk to finish any additional paperwork. Patient verbalized understanding. Pt left department w/ no further questions. 

## 2016-03-06 NOTE — ED Provider Notes (Signed)
Patient returned for pelvic ultrasound Large mass noted D/w patient need for outpatient MRI Given info to local OBGYN Pt agreeable with plan   Ripley Fraise, MD 03/06/16 1341

## 2016-03-06 NOTE — Discharge Instructions (Signed)

## 2016-03-08 LAB — URINE CULTURE: Special Requests: NORMAL

## 2016-03-08 LAB — GC/CHLAMYDIA PROBE AMP (~~LOC~~) NOT AT ARMC
Chlamydia: POSITIVE — AB
Neisseria Gonorrhea: NEGATIVE

## 2016-03-09 ENCOUNTER — Telehealth (HOSPITAL_BASED_OUTPATIENT_CLINIC_OR_DEPARTMENT_OTHER): Payer: Self-pay | Admitting: Emergency Medicine

## 2016-03-09 NOTE — Telephone Encounter (Signed)
Chart handoff to EDP for treatment plan for + Chlamydia 

## 2016-03-10 NOTE — Telephone Encounter (Signed)
Post ED Visit - Positive Culture Follow-up: Successful Patient Follow-Up  Culture assessed and recommendations reviewed by: []  Elenor Quinones, Pharm.D. []  Heide Guile, Pharm.D., BCPS []  Parks Neptune, Pharm.D. []  Alycia Rossetti, Pharm.D., BCPS []  [place], Pharm.D., BCPS, AAHIVP []  Legrand Como, Pharm.D., BCPS, AAHIVP []  Milus Glazier, Pharm.D. []  Rob Evette Doffing, Pharm.D.  Positive chlamydia culture  [x]  Patient discharged without antimicrobial prescription and treatment is now indicated []  Organism is resistant to prescribed ED discharge antimicrobial []  Patient with positive blood cultures  Changes discussed with ED provider: Donnald Garre PA New antibiotic prescription start azithromycin 1000mg  po once  Attempting to contact patient   Hazle Nordmann 03/10/2016, 12:04 PM

## 2017-01-15 ENCOUNTER — Emergency Department (HOSPITAL_COMMUNITY)
Admission: EM | Admit: 2017-01-15 | Discharge: 2017-01-15 | Disposition: A | Discharge status: Home / Self Care | Payer: Self-pay | Attending: Emergency Medicine | Admitting: Emergency Medicine

## 2017-01-15 ENCOUNTER — Emergency Department (HOSPITAL_COMMUNITY): Payer: Self-pay

## 2017-01-15 ENCOUNTER — Encounter (HOSPITAL_COMMUNITY): Payer: Self-pay

## 2017-01-15 DIAGNOSIS — Z87891 Personal history of nicotine dependence: Secondary | ICD-10-CM | POA: Insufficient documentation

## 2017-01-15 DIAGNOSIS — D279 Benign neoplasm of unspecified ovary: Secondary | ICD-10-CM | POA: Insufficient documentation

## 2017-01-15 LAB — URINALYSIS, ROUTINE W REFLEX MICROSCOPIC
Bacteria, UA: NONE SEEN
Bilirubin Urine: NEGATIVE
GLUCOSE, UA: NEGATIVE mg/dL
Hgb urine dipstick: NEGATIVE
Ketones, ur: NEGATIVE mg/dL
Nitrite: NEGATIVE
PH: 6 (ref 5.0–8.0)
Protein, ur: NEGATIVE mg/dL
SPECIFIC GRAVITY, URINE: 1.014 (ref 1.005–1.030)

## 2017-01-15 LAB — CBC WITH DIFFERENTIAL/PLATELET
Basophils Absolute: 0 10*3/uL (ref 0.0–0.1)
Basophils Relative: 0 %
Eosinophils Absolute: 0.1 10*3/uL (ref 0.0–0.7)
Eosinophils Relative: 1 %
HEMATOCRIT: 32.2 % — AB (ref 36.0–46.0)
HEMOGLOBIN: 10.8 g/dL — AB (ref 12.0–15.0)
LYMPHS ABS: 2.1 10*3/uL (ref 0.7–4.0)
LYMPHS PCT: 40 %
MCH: 30.8 pg (ref 26.0–34.0)
MCHC: 33.5 g/dL (ref 30.0–36.0)
MCV: 91.7 fL (ref 78.0–100.0)
MONOS PCT: 7 %
Monocytes Absolute: 0.4 10*3/uL (ref 0.1–1.0)
NEUTROS ABS: 2.6 10*3/uL (ref 1.7–7.7)
NEUTROS PCT: 52 %
Platelets: 240 10*3/uL (ref 150–400)
RBC: 3.51 MIL/uL — AB (ref 3.87–5.11)
RDW: 12.6 % (ref 11.5–15.5)
WBC: 5.1 10*3/uL (ref 4.0–10.5)

## 2017-01-15 LAB — BASIC METABOLIC PANEL
Anion gap: 5 (ref 5–15)
BUN: 9 mg/dL (ref 6–20)
CHLORIDE: 104 mmol/L (ref 101–111)
CO2: 27 mmol/L (ref 22–32)
CREATININE: 0.84 mg/dL (ref 0.44–1.00)
Calcium: 9.1 mg/dL (ref 8.9–10.3)
GFR calc Af Amer: >60 mL/min (ref >60)
Glucose, Bld: 89 mg/dL (ref 65–99)
POTASSIUM: 3.8 mmol/L (ref 3.5–5.1)
SODIUM: 136 mmol/L (ref 135–145)

## 2017-01-15 LAB — PREGNANCY, URINE: Preg Test, Ur: NEGATIVE

## 2017-01-15 MED ORDER — IBUPROFEN 800 MG PO TABS
800.0000 mg | ORAL_TABLET | Freq: Once | ORAL | Status: AC
  Administered 2017-01-15: 800 mg via ORAL
  Filled 2017-01-15: qty 1

## 2017-01-15 MED ORDER — IOPAMIDOL (ISOVUE-300) INJECTION 61%
100.0000 mL | Freq: Once | INTRAVENOUS | Status: AC | PRN
  Administered 2017-01-15: 100 mL via INTRAVENOUS

## 2017-01-15 MED ORDER — ACETAMINOPHEN 325 MG PO TABS
650.0000 mg | ORAL_TABLET | Freq: Once | ORAL | Status: AC
  Administered 2017-01-15: 650 mg via ORAL
  Filled 2017-01-15: qty 2

## 2017-01-15 MED ORDER — IBUPROFEN 600 MG PO TABS: 600.0000 mg | ORAL_TABLET | Freq: Four times a day (QID) | ORAL | 0 refills | Status: DC

## 2017-01-15 NOTE — ED Notes (Signed)
Reminded pt we needed a urine sample 

## 2017-01-15 NOTE — ED Notes (Signed)
Went to update vital pt gone to radiology

## 2017-01-15 NOTE — ED Provider Notes (Signed)
[facility] DEPT Provider Note   CSN: [id number] Arrival date & time: 01/15/17  0849     History   Chief Complaint Chief Complaint  Patient presents with  . Abdominal Pain    HPI Kim Zimmerman is a 26 y.o. female.  Patient is a 26 year old female who presents to the emergency department with a complaint of abdominal pain.  The patient states that nearly a year ago she was diagnosed with a mass in her lower abdomen. She was advised to have an outpatient MRI of this mass, but states she does not have insurance, and does not have the financial ability to obtain the MRI. The patient states that over the last 2 months the pain has been increasing. Over the last 3 or 4 days she feels as though the mass is moving, and causing her more soreness in her abdomen. 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 It really does not get any better when she stands but does improve a little bit. She has not had any vomiting, no vaginal bleeding, no blood in her urine, no blood in her stool, and no known fever. She presents now because she is concerned that the mass may be getting larger, or developing into something serious.      History reviewed. No pertinent past medical history.  There are no active problems to display for this patient.   Past Surgical History:  Procedure Laterality Date  . BREAST REDUCTION SURGERY      OB History    Gravida Para Term Preterm AB Living   1         3   SAB TAB Ectopic Multiple Live Births                   Home Medications    Prior to Admission medications   Medication Sig Start Date End Date Taking? Authorizing Provider  ibuprofen (ADVIL,MOTRIN) 600 MG tablet Take 1 tablet (600 mg total) by mouth 4 (four) times daily. 01/15/17   Lily Kocher, PA-C  metroNIDAZOLE (FLAGYL) 500 MG tablet Take 1 tablet (500 mg total) by mouth 2 (two) times daily. 03/06/16   Evalee Jefferson, PA-C  naproxen  (NAPROSYN) 500 MG tablet Take 1 tablet (500 mg total) by mouth 2 (two) times daily as needed for moderate pain. 03/06/16   Evalee Jefferson, PA-C    Family History No family history on file.  Social History Social History  Substance Use Topics  . Smoking status: Former Smoker    Packs/day: 0.50    Types: Cigarettes    Quit date: 09/05/2014  . Smokeless tobacco: Never Used  . Alcohol use No     Allergies   Patient has no known allergies.   Review of Systems Review of Systems  Gastrointestinal: Negative for abdominal pain.  Genitourinary: Positive for pelvic pain. Negative for difficulty urinating, dysuria, frequency, hematuria, vaginal bleeding, vaginal discharge and vaginal pain.  All other systems reviewed and are negative.    Physical Exam Updated Vital Signs BP (!) 99/59 (BP Location: Left Arm)   Pulse 70   Temp 98.8 F (37.1 C) (Oral)   Resp 18   Ht 5\' 4"  (1.626 m)   Wt 140 lb (63.5 kg)   LMP 12/25/2016 (Approximate)   SpO2 100%   Breastfeeding? No   BMI 24.03 kg/m   Physical Exam  Constitutional: She is oriented to person, place, and time. She  appears well-developed and well-nourished.  Non-toxic appearance.  HENT:  Head: Normocephalic.  Right Ear: Tympanic membrane and external ear normal.  Left Ear: Tympanic membrane and external ear normal.  Eyes: EOM and lids are normal. Pupils are equal, round, and reactive to light.  Neck: Normal range of motion. Neck supple. Carotid bruit is not present.  Cardiovascular: Normal rate, regular rhythm, normal heart sounds, intact distal pulses and normal pulses.   Pulmonary/Chest: Breath sounds normal. No respiratory distress.  Abdominal: Soft. Bowel sounds are normal. She exhibits mass. There is tenderness. There is no guarding.  The abdomen is soft with good bowel sounds. There is a palpable mass in the suprapubic area. It is movable. It is tender to palpation. There is no pulse and station mass appreciated. There is no  hepatomegaly or splenomegaly appreciated.  Musculoskeletal: Normal range of motion.  Lymphadenopathy:       Head (right side): No submandibular adenopathy present.       Head (left side): No submandibular adenopathy present.    She has no cervical adenopathy.  Neurological: She is alert and oriented to person, place, and time. She has normal strength. No cranial nerve deficit or sensory deficit.  Skin: Skin is warm and dry.  Psychiatric: She has a normal mood and affect. Her speech is normal.  Nursing note and vitals reviewed.    ED Treatments / Results  Labs (all labs ordered are listed, but only abnormal results are displayed) Labs Reviewed  URINALYSIS, ROUTINE W REFLEX MICROSCOPIC - Abnormal; Notable for the following:       Result Value   Leukocytes, UA MODERATE (*)    Squamous Epithelial / LPF 0-5 (*)    All other components within normal limits  CBC WITH DIFFERENTIAL/PLATELET - Abnormal; Notable for the following:    RBC 3.51 (*)    Hemoglobin 10.8 (*)    HCT 32.2 (*)    All other components within normal limits  URINE CULTURE  PREGNANCY, URINE  BASIC METABOLIC PANEL    EKG  EKG Interpretation None       Radiology Ct Abdomen Pelvis W Contrast  Result Date: 01/15/2017 CLINICAL DATA:  Abdominal pain for 2 months. Palpable mass in the right lower quadrant. Intermittent diarrhea. EXAM: CT ABDOMEN AND PELVIS WITH CONTRAST TECHNIQUE: Multidetector CT imaging of the abdomen and pelvis was performed using the standard protocol following bolus administration of intravenous contrast. CONTRAST:  168mL ISOVUE-300 IOPAMIDOL (ISOVUE-300) INJECTION 61% COMPARISON:  Pelvic ultrasound 03/06/2016. FINDINGS: Lower chest: Negative. Hepatobiliary: Negative. Pancreas: Negative. Spleen: Negative. Adrenals/Urinary Tract: Adrenal glands and right kidney are unremarkable. 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 Ureters are decompressed. Bladder is grossly unremarkable. Stomach/Bowel: Stomach, small bowel,  appendix and colon are unremarkable. Vascular/Lymphatic: Retroaortic left renal vein. Vascular structures are otherwise unremarkable. No pathologically enlarged lymph nodes. Reproductive: Uterus is visualized. There is a large cystic mass arising from the anterior central anatomic pelvis, measuring approximately 9.5 x 15.5 x 19.4 cm, with areas of internal fat and calcification. Mass measured approximately 10.2 x 4.5 x 6.7 cm on 03/06/2016. Other: Small pelvic free fluid.  Trace perihepatic ascites. Musculoskeletal: Negative. IMPRESSION: 1. Large pelvic mass with fluid, fat and calcified components, likely increased in size from 03/06/2016 and indicative of an ovarian dermoid. Given interval increase in size as well as sheer overall size, surgical consultation is recommended. 2. Small ascites. Electronically Signed   By: Lorin Picket M.D.   On: 01/15/2017 10:46    Procedures Procedures (including critical care  time)  Medications Ordered in ED Medications  iopamidol (ISOVUE-300) 61 % injection 100 mL (100 mLs Intravenous Contrast Given 01/15/17 1032)  ibuprofen (ADVIL,MOTRIN) tablet 800 mg (800 mg Oral Given 01/15/17 1111)  acetaminophen (TYLENOL) tablet 650 mg (650 mg Oral Given 01/15/17 1111)     Initial Impression / Assessment and Plan / ED Course  I have reviewed the triage vital signs and the nursing notes.  Pertinent labs & imaging results that were available during my care of the patient were reviewed by me and considered in my medical decision making (see chart for details).       Final Clinical Impressions(s) / ED Diagnoses MDM Vital signs within normal limits. CT scan reveals a large pelvic mass with fluid, fat, and calcified contents, consistent with a dermoid cyst. It is believed that this is emanating from the area of an ovary. The white blood cell count is within normal limits. The temperature is within normal limits. The pulse rate is within normal limits.  I've made the  patient aware of the scan results. And I have asked her to see Dr. Glo Herring for surgical evaluation concerning this cyst. I also made her aware that this is not a cyst is going to go away on its own but will require surgical intervention. Questions were answered. The patient is in agreement with this plan.    Final diagnoses:  Dermoid cyst of ovary, unspecified laterality    New Prescriptions New Prescriptions   IBUPROFEN (ADVIL,MOTRIN) 600 MG TABLET    Take 1 tablet (600 mg total) by mouth 4 (four) times daily.     Lily Kocher, PA-C 01/15/17 1120    Fredia Sorrow, MD 01/16/17 [phone]

## 2017-01-15 NOTE — Discharge Instructions (Signed)
The CT scan shows that the cyst in your abdomen has enlarged a little bit. There is question as to whether or not it may be coming off of your ovary. It is called a dermoid cyst, and appears benign at this point. It is important however that you see Dr. Glo Herring for surgical opinion, as this can cause your ovary to twist on itself and cause problems with blood flow to your ovary which becomes an emergency. Please see Dr. Glo Herring, or the surgeon of your choice as soon as possible. Use 600 mg of ibuprofen, and 500 mg of Tylenol every 6 hours for soreness or discomfort.

## 2017-01-15 NOTE — ED Triage Notes (Signed)
Pt reports abd pain for the past 2 months.  Reports she had a palpable "mass" in rlq that was evaluated 2 months ago.  Pt says feels like it's getting bigger.  Reports intermittent diarrhea.

## 2017-01-17 ENCOUNTER — Telehealth: Payer: Self-pay | Admitting: Obstetrics and Gynecology

## 2017-01-17 LAB — URINE CULTURE

## 2017-01-17 NOTE — Telephone Encounter (Signed)
Kim Zimmerman, please review appts and make a decision. If you have questions, ask me in real time. This does not help

## 2017-01-18 NOTE — Telephone Encounter (Signed)
Called patient to let her know we had an opening at 3pm tomorrow if she wanted to be seen. Stated she could come.

## 2017-01-19 ENCOUNTER — Encounter: Payer: Self-pay | Admitting: Obstetrics and Gynecology

## 2017-01-19 ENCOUNTER — Ambulatory Visit (INDEPENDENT_AMBULATORY_CARE_PROVIDER_SITE_OTHER): Payer: Self-pay | Admitting: Obstetrics and Gynecology

## 2017-01-19 VITALS — BP 110/64 | HR 98 | Wt 147.0 lb

## 2017-01-19 DIAGNOSIS — D279 Benign neoplasm of unspecified ovary: Secondary | ICD-10-CM

## 2017-01-19 NOTE — Progress Notes (Signed)
Patient ID: Kim Zimmerman, female   DOB: 05-18-1991, 26 y.o.   MRN: [id number]    [facility] Visit  @DATE @            Patient name: Kim Zimmerman MRN [id number]  Date of birth: 04-Oct-1990  CC & HPI:   Chief Complaint  Patient presents with  . Follow-up    ED visit     Berlin Viereck is a 26 y.o. female presenting today for ER f/u. Pt was seen in the ER on 01/15/17 for abdominal pain and had CT that revealed large pelvic mass with fluid, fat and calcified contents, consistent with a dermoid cyst, believed to be eminating from the area of an ovary. She states this issue intermittently causes her abdominal pain, but she is not in any current pain.  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 The slight but unavoidable risk of torsion or rupture is discussed.  ROS:  ROS ER f/u  + intermittent abdominal pain in the morning.  Pertinent History Reviewed:   Reviewed  Medical        No past medical history on file.                            Surgical Hx:    Past Surgical History:  Procedure Laterality Date  . BREAST REDUCTION SURGERY     Medications: Reviewed & Updated - see associated section                       Current Outpatient Prescriptions:  .  ibuprofen (ADVIL,MOTRIN) 600 MG tablet, Take 1 tablet (600 mg total) by mouth 4 (four) times daily., Disp: 30 tablet, Rfl: 0   Social History: Reviewed -  reports that she quit smoking about 2 years ago. Her smoking use included Cigarettes. She smoked 0.50 packs per day. She has never used smokeless tobacco.  Objective Findings:  Vitals: Blood pressure 110/64, pulse 98, weight 147 lb (66.7 kg), last menstrual period 12/25/2016.  Physical Examination: General appearance - alert, well appearing, and in no distress Mental status - alert, oriented to person, place, and time Abdomen - soft, nontender, nondistended, palpable mass 4 cm above the umbilicus, equal in size to ~24w pregnancy   Assessment & Plan:   A:  1.  Dermoid cyst , huge.24 wk size. 2. Recommended pt look into the affordable care act in addition to [facility] discount program   P:  1. F/u in ~1 month for discussion of surgery after pt gets insurance coverage settled     By signing my name below, I, Hansel Feinstein, attest that this documentation has been prepared under the direction and in the presence of Jonnie Kind, MD. Electronically Signed: Hansel Feinstein, ED Scribe. 01/19/17. 3:30 PM.  I personally performed the services described in this documentation, which was SCRIBED in my presence. The recorded information has been reviewed and considered accurate. It has been edited as necessary during review. Jonnie Kind, MD

## 2017-01-19 NOTE — Patient Instructions (Signed)
de

## 2017-02-02 ENCOUNTER — Encounter: Payer: Self-pay | Admitting: Obstetrics and Gynecology

## 2017-02-15 ENCOUNTER — Telehealth: Payer: Self-pay | Admitting: Obstetrics and Gynecology

## 2017-02-15 NOTE — Telephone Encounter (Signed)
Message left for the patient to make a follow-up appointment with me so that we can be sure the mass is not growing. The mother states that the mass is indeed growing, and the patient has not been able to pursue insurance through the affordable care act until October Patient has to make an appointment

## 2017-02-16 ENCOUNTER — Telehealth: Payer: Self-pay | Admitting: Obstetrics and Gynecology

## 2017-02-16 NOTE — Telephone Encounter (Signed)
Patient calls back and will make an appointment as she thinks the mass is getting larger and more symptomatic. Will probably proceed toward surgery and she'll work with the business office to address financial concerns

## 2017-02-23 ENCOUNTER — Other Ambulatory Visit (HOSPITAL_COMMUNITY)
Admission: RE | Admit: 2017-02-23 | Discharge: 2017-02-23 | Disposition: A | Discharge status: Home / Self Care | Payer: Self-pay | Source: Ambulatory Visit | Attending: Obstetrics and Gynecology | Admitting: Obstetrics and Gynecology

## 2017-02-23 ENCOUNTER — Encounter: Payer: Self-pay | Admitting: Obstetrics and Gynecology

## 2017-02-23 ENCOUNTER — Ambulatory Visit (INDEPENDENT_AMBULATORY_CARE_PROVIDER_SITE_OTHER): Payer: Self-pay | Admitting: Obstetrics and Gynecology

## 2017-02-23 VITALS — BP 90/48 | HR 60 | Wt 144.2 lb

## 2017-02-23 DIAGNOSIS — Z124 Encounter for screening for malignant neoplasm of cervix: Secondary | ICD-10-CM

## 2017-02-23 DIAGNOSIS — R87628 Other abnormal cytological findings on specimens from vagina: Secondary | ICD-10-CM | POA: Insufficient documentation

## 2017-02-23 DIAGNOSIS — A749 Chlamydial infection, unspecified: Secondary | ICD-10-CM | POA: Insufficient documentation

## 2017-02-23 DIAGNOSIS — R1900 Intra-abdominal and pelvic swelling, mass and lump, unspecified site: Secondary | ICD-10-CM

## 2017-02-23 NOTE — Patient Instructions (Addendum)
We will obtain a CA-125, cancer marker, get a pelvic ultrasound next week in the office in 1 week, then schedule surgery at that time  Chlamydia, Female Chlamydia is an STD (sexually transmitted disease). This is an infection that spreads through sexual contact. If it is not treated, it can cause serious problems. It must be treated with antibiotic medicine. Sometimes, you may not have symptoms (asymptomatic). When you have symptoms, they can include:  Burning when you pee (urinate).  Peeing often.  Fluid (discharge) coming from the vagina.  Redness, soreness, and swelling (inflammation) of the butt (rectum).  Bleeding or fluid coming from the butt.  Belly (abdominal) pain.  Pain during sex.  Bleeding between periods.  Itching, burning, or redness in the eyes.  Fluid coming from the eyes.  Follow these instructions at home: Medicines  Take over-the-counter and prescription medicines only as told by your doctor.  Take your antibiotic medicine as told by your doctor. Do not stop taking the antibiotic even if you start to feel better. Sexual activity  Tell sex partners about your infection. Sex partners are people you had oral, anal, or vaginal sex with within 60 days of when you started getting sick. They need treatment, too.  Do not have sex until: ? You and your sex partners have been treated. ? Your doctor says it is okay.  If you have a single dose treatment, wait 7 days before having sex. General instructions  It is up to you to get your test results. Ask your doctor when your results will be ready.  Get a lot of rest.  Eat healthy foods.  Drink enough fluid to keep your pee (urine) clear or pale yellow.  Keep all follow-up visits as told by your doctor. You may need tests after 3 months. Preventing chlamydia  The only way to prevent chlamydia is not to have sex. To lower your risk: ? Use latex condoms correctly. Do this every time you have sex. ? Avoid  having many sex partners. ? Ask if your partner has been tested for STDs and if he or she had negative results. Contact a doctor if:  You get new symptoms.  You do not get better with treatment.  You have a fever or chills.  You have pain during sex. Get help right away if:  Your pain gets worse and does not get better with medicine.  You get flu-like symptoms, such as: ? Night sweats. ? Sore throat. ? Muscle aches.  You feel sick to your stomach (nauseous).  You throw up (vomit).  You have trouble swallowing.  You have bleeding: ? Between periods. ? After sex.  You have irregular periods.  You have belly pain that does not get better with medicine.  You have lower back pain that does not get better with medicine.  You feel weak or dizzy.  You pass out (faint).  You are pregnant and you get symptoms of chlamydia. Summary  Chlamydia is an infection that spreads through sexual contact.  Sometimes, chlamydia can cause no symptoms (asymptomatic).  Do not have sex until your doctor says it is okay.  All sex partners will have to be treated for chlamydia. This information is not intended to replace advice given to you by your health care provider. Make sure you discuss any questions you have with your health care provider. Document Released: 05/25/2008 Document Revised: 08/05/2016 Document Reviewed: 08/05/2016 Elsevier Interactive Patient Education  2017 [place] Sexually Transmitted Infections, Adult  Sexually transmitted infections (STIs) are diseases that are passed (transmitted) from person to person through bodily fluids exchanged during sex or sexual contact. Bodily fluids include saliva, semen, blood, vaginal mucus, and urine. You may have an increased risk for developing an STI if you have unprotected oral, vaginal, or anal sex. Some common STIs include:  Herpes.  Hepatitis B.  Chlamydia.  Gonorrhea.  Syphilis.  HPV (human  papillomavirus).  HIV (humanimmunodeficiency virus), the virus that can cause AIDS (acquired immunodeficiency virus).  How can I protect myself from sexually transmitted infections? The only way to completely prevent STIs is not to have sex of any kind (practice abstinence). This includes oral, vaginal, or anal sex. If you are sexually active, take these actions to lower your risk of getting an STI:  Have only one sex partner (be monogamous) or limit the number of sexual partners you have.  Stay up-to-date on immunizations. Certain vaccines can lower your risk of getting certain STIs, such as: ? Hepatitis A and B vaccines. You may have been vaccinated as a young child, but likely need a booster shot as a teen or young adult. ? HPV vaccine. This vaccine is recommended if you are a man under age 57 or a woman under age 46.  Use methods that prevent the exchange of body fluids between partners (barrier protection) every time you have sex. Barrier protection can be used during oral, vaginal, or anal sex. Commonly used barrier methods include: ? Female condom. ? Female condom. ? Dental dam.  Get tested regularly for STIs. Have your sexual partner get tested regularly as well.  Avoid mixing alcohol, drugs, and sex. Alcohol and drug use can affect your ability to make good decisions and can lead to risky sexual behaviors.  Ask your health care provider about taking pre-exposure prophylaxis (PrEP) to prevent HIV infection if you: ? Have a HIV-positive sexual partner. ? Have multiple sexual partners or partners who do not know their HIV status, and do not regularly use a condom during sex. ? Use injection drugs and share needles.  Birth control pills, injections, implants, and intrauterine devices (IUDs) do not protect against STIs. To prevent both STIs and pregnancy, always use a condom with another form of birth control. Some STIs, such as herpes, are spread through skin to skin contact. A condom  does not protect you from getting such STIs. If you or your partner have herpes and there is an active flare with open sores, avoid all sexual contact. Why are these changes important? Taking steps to practice safe sex protects you and others. Many STIs can be cured. However, some STIs are not curable and will affect you for the rest of your life. STIs can be passed on to another person even if you do not have symptoms. What can happen if changes are not made? Certain STIs may:  Require you to take medicine for the rest of your life.  Affect your ability to have children (your fertility).  Increase your risk for developing another STI or certain serious health conditions, such as: ? Cervical cancer. ? Head and neck cancer. ? Pelvic inflammatory disease (PID) in women. ? Organ damage or damage to other parts of your body, if the infection spreads.  Be passed to a baby during childbirth.  How are sexually transmitted infections treated? If you or your partner know or think that you may have an STI:  Talk with your healthcare provider about what can be done to treat it.  Some STIs can be treated and cured with medicines.  For curable STIs, you and your partner should avoid sex during treatment and for several days after treatment is complete.  You and your partner should both be treated at the same time, if there is any chance that your partner is infected as well. If you get treatment but your partner does not, your partner can re-infect you when you resume sexual contact.  Do not have unprotected sex.  Where to find more information: Learn more about sexually transmitted diseases and infections from:  Centers for Disease Control and Prevention: ? More information about specific STIs: AppraiserFraud.fi ? Find places to get sexual health counseling and treatment for free or for a low cost: gettested.StoreMirror.com.cy  U.S. Department of Health and Human Services:  http://white.info/.html  Summary  The only way to completely prevent STIs is not to have sex (practice abstinence), including oral, vaginal, or anal sex.  STIs can spread through saliva, semen, blood, vaginal mucus, urine, or sexual contact.  If you do have sex, limit your number of sexual partners and use a barrier protection method every time you have sex.  If you develop an STI, get treated right away and ask your partner to be treated as well. Do not resume having sex until both of you have completed treatment for the STI. This information is not intended to replace advice given to you by your health care provider. Make sure you discuss any questions you have with your health care provider. Document Released: 08/12/2016 Document Revised: 08/12/2016 Document Reviewed: 08/12/2016 Elsevier Interactive Patient Education  [employer].

## 2017-02-23 NOTE — Progress Notes (Signed)
   [facility] Clinic Visit  @DATE @            Patient name: Kim Zimmerman MRN [id number]  Date of birth: [birth year]  CC & HPI:  Kim Zimmerman is a 26 y.o. female presenting today for follow up. She has a dermoid cyst that is increasing in size. On 03/06/16 it measured 10.2 x 4.5 x 6.7 cm; on 01/15/17 it measured 9.5 x 15.5 x 19.4 cm. She states the cyst may be right sided based on evaluating her pain. She would like to discuss surgery to have this removed.  ROS:  ROS +abdominal discomfort secondary to dermoid cyst -fever   Pertinent History Reviewed:   Reviewed: Significant for  Medical        No past medical history on file.                            Surgical Hx:    Past Surgical History:  Procedure Laterality Date  . BREAST REDUCTION SURGERY     Medications: Reviewed & Updated - see associated section                       Current Outpatient Prescriptions:  .  ibuprofen (ADVIL,MOTRIN) 600 MG tablet, Take 1 tablet (600 mg total) by mouth 4 (four) times daily., Disp: 30 tablet, Rfl: 0   Social History: Reviewed -  reports that she has been smoking Cigarettes.  She has been smoking about 0.50 packs per day. She has never used smokeless tobacco.  Objective Findings:  Vitals: Blood pressure (!) 90/48, pulse 60, weight 144 lb 3.2 oz (65.4 kg), last menstrual period 01/31/2017.  Physical Examination: General appearance - alert, well appearing, and in no distress Mental status - alert, oriented to person, place, and time Cardio - regular rate and rhythm, heart sounds are normal Respiratory: lungs are clear to auscultation  Pelvic - normal external genitalia, normal vulva, normal vagina, cervix appears multiparous, uterus tilts to the back, and huge mass to above the umbilicus Reviewed on CT where different textrure tissues and bony parts are noted ,  Pap smear and GC/Chlamydia collected   Assessment & Plan:   A:  1. Ovarian dermoid 2. Discussion of surgery  P:  1. Pelvic US  and labs 2. Surgical removal 3. Pre-op visit in 1 week with u/s to be done 4 check pap records repeat pap if not in record.   By signing my name below, I, Sonum Patel, attest that this documentation has been prepared under the direction and in the presence of Jonnie Kind, MD. Electronically Signed: Sonum Patel, Scribe. 02/23/17. 12:02 PM.  I personally performed the services described in this documentation, which was SCRIBED in my presence. The recorded information has been reviewed and considered accurate. It has been edited as necessary during review. Jonnie Kind, MD

## 2017-02-26 LAB — CYTOLOGY - PAP
CHLAMYDIA, DNA PROBE: POSITIVE — AB
Diagnosis: NEGATIVE
Neisseria Gonorrhea: NEGATIVE

## 2017-03-01 ENCOUNTER — Telehealth: Payer: Self-pay | Admitting: *Deleted

## 2017-03-01 ENCOUNTER — Telehealth: Payer: Self-pay | Admitting: Obstetrics and Gynecology

## 2017-03-01 MED ORDER — AZITHROMYCIN 500 MG PO TABS: 1000.0000 mg | ORAL_TABLET | Freq: Once | ORAL | 0 refills | Status: AC

## 2017-03-01 NOTE — Addendum Note (Signed)
Addended by: Jonnie Kind on: 03/01/2017 09:36 AM   Modules accepted: Orders

## 2017-03-01 NOTE — Telephone Encounter (Signed)
Spoke with pt letting her know CHL was positive. Dr. Glo Herring has sent prescription to pharmacy. Pt was advised her and partner needs to be treated. Pt is in [place] and will have pharmacy transfer prescription.  Advised no sex until after POC appt in 4 weeks. Pt had bad signal and will call back to schedule appt. [place]

## 2017-03-01 NOTE — Telephone Encounter (Signed)
Patient informed of + Chl results and will pick up prescription on Friday. Patient is aware that partners prescription is included. Patient is self-pay, so she can pick up prescription for both

## 2017-03-01 NOTE — Telephone Encounter (Signed)
Mailbox full

## 2017-03-03 ENCOUNTER — Other Ambulatory Visit: Payer: Self-pay | Admitting: Obstetrics and Gynecology

## 2017-03-03 DIAGNOSIS — R1900 Intra-abdominal and pelvic swelling, mass and lump, unspecified site: Secondary | ICD-10-CM

## 2017-03-04 ENCOUNTER — Other Ambulatory Visit: Payer: Self-pay

## 2017-03-04 ENCOUNTER — Encounter: Payer: Self-pay | Admitting: Obstetrics and Gynecology

## 2017-03-11 ENCOUNTER — Telehealth: Payer: Self-pay | Admitting: *Deleted

## 2017-03-11 NOTE — Telephone Encounter (Signed)
Informed patient appointment was made for July 16 at 11:30 for pelvic ultrasound. Informed she needed to drink at least 32 oz of water an hour before ultrasound and go with a full bladder. Also informed of appointment her on the same day at 2:15. Verbalized understanding.

## 2017-03-14 ENCOUNTER — Ambulatory Visit (HOSPITAL_COMMUNITY)
Admission: RE | Admit: 2017-03-14 | Discharge: 2017-03-14 | Disposition: A | Discharge status: Home / Self Care | Payer: Self-pay | Source: Ambulatory Visit | Attending: Obstetrics and Gynecology | Admitting: Obstetrics and Gynecology

## 2017-03-14 ENCOUNTER — Encounter: Payer: Self-pay | Admitting: Obstetrics and Gynecology

## 2017-03-14 ENCOUNTER — Other Ambulatory Visit: Payer: Self-pay

## 2017-03-14 ENCOUNTER — Ambulatory Visit (INDEPENDENT_AMBULATORY_CARE_PROVIDER_SITE_OTHER): Payer: Self-pay | Admitting: Obstetrics and Gynecology

## 2017-03-14 DIAGNOSIS — D271 Benign neoplasm of left ovary: Secondary | ICD-10-CM

## 2017-03-14 DIAGNOSIS — D270 Benign neoplasm of right ovary: Secondary | ICD-10-CM | POA: Insufficient documentation

## 2017-03-14 DIAGNOSIS — R1900 Intra-abdominal and pelvic swelling, mass and lump, unspecified site: Secondary | ICD-10-CM | POA: Insufficient documentation

## 2017-03-14 NOTE — Progress Notes (Signed)
Preoperative History and Physical  Kim Zimmerman is a 26 y.o. 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  No significant preoperative concerns. Pt has been recently treated for chlamydia., It is too early for peripheral vascular The patient is undecided about future childbearing plans. She is a gravida G3P3003   Proposed surgery: right oophorectomy and left ovarian cystectomy for ovarian dermoid bilateral  Past Medical History:  Diagnosis Date  . Dermoid cyst    Past Surgical History:  Procedure Laterality Date  . BREAST REDUCTION SURGERY     OB History  Gravida Para Term Preterm AB Living  1         0  SAB TAB Ectopic Multiple Live Births               # Outcome Date GA Lbr Len/2nd Weight Sex Delivery Anes PTL Lv  1 Gravida              Birth Comments: System Generated. Please review and update pregnancy details.    Patient denies any other pertinent gynecologic issues.   Current Outpatient Prescriptions on File Prior to Visit  Medication Sig Dispense Refill  . ibuprofen (ADVIL,MOTRIN) 600 MG tablet Take 1 tablet (600 mg total) by mouth 4 (four) times daily. 30 tablet 0   No current facility-administered medications on file prior to visit.    No Known Allergies  Social History:   reports that she has been smoking Cigarettes.  She has been smoking about 0.25 packs per day. She has never used smokeless tobacco. She reports that she does not drink alcohol or use drugs.  Family History  Problem Relation Age of Onset  . Adopted: Yes    Review of Systems: Noncontributory  PHYSICAL EXAM: There were no vitals taken for this visit. General appearance - alert, well appearing, and in no distress Chest - clear to auscultation, no wheezes, rales or rhonchi, symmetric air entry Heart - normal rate and regular rhythm Abdomen - soft, nontender, nondistended, 24 cm masses to above umbilicus          Pelvic - examination: uterus is retroverted recent Pap negative for  abnormalities with normal GC and chlamydia Extremities - peripheral pulses normal, no pedal edema, no clubbing or cyanosis  Labs:  Imaging Studies: US Transvaginal Non-ob  Result Date: 03/14/2017 CLINICAL DATA:  Right lower quadrant pain.  Pelvic mass in female. EXAM: TRANSABDOMINAL AND TRANSVAGINAL ULTRASOUND OF PELVIS TECHNIQUE: Both transabdominal and transvaginal ultrasound examinations of the pelvis were performed. Transabdominal technique was performed for global imaging of the pelvis including uterus, ovaries, adnexal regions, and pelvic cul-de-sac. It was necessary to proceed with endovaginal exam following the transabdominal exam to visualize the endometrium and ovaries. COMPARISON:  CT on 01/15/2017 FINDINGS: Uterus Measurements: 7.9 x 5.0 x 5.2 cm. No fibroids or other mass visualized. Endometrium Thickness: 14 mm.  No focal abnormality visualized. Right ovary Measurements: No normal ovary visualized. A large cystic and solid mass is seen in the right pelvis which measures 17.8 x 9.2 x 16.2 cm. This has a solid echogenic worsening with acoustic shadowing, consistent with internal fat. This corresponds with the lesion seen on recent CT comments consistent with a large benign ovarian dermoid. Left ovary Measurements: 4.9 x 2.7 x 2.7 cm. A hyperechoic mass is seen within the ovary measuring 2.6 x 1.6 x 1.8 cm. This is consistent with a small benign ovarian dermoid. Other findings Small amount of simple free fluid noted. IMPRESSION: Bilateral ovarian dermoids measuring 17.8  cm on the right, and 2.6 cm on the left. Normal appearance of uterus. Electronically Signed   By: Earle Gell M.D.   On: 03/14/2017 12:48   US Pelvis Complete  Result Date: 03/14/2017 CLINICAL DATA:  Right lower quadrant pain.  Pelvic mass in female. EXAM: TRANSABDOMINAL AND TRANSVAGINAL ULTRASOUND OF PELVIS TECHNIQUE: Both transabdominal and transvaginal ultrasound examinations of the pelvis were performed. Transabdominal  technique was performed for global imaging of the pelvis including uterus, ovaries, adnexal regions, and pelvic cul-de-sac. It was necessary to proceed with endovaginal exam following the transabdominal exam to visualize the endometrium and ovaries. COMPARISON:  CT on 01/15/2017 FINDINGS: Uterus Measurements: 7.9 x 5.0 x 5.2 cm. No fibroids or other mass visualized. Endometrium Thickness: 14 mm.  No focal abnormality visualized. Right ovary Measurements: No normal ovary visualized. A large cystic and solid mass is seen in the right pelvis which measures 17.8 x 9.2 x 16.2 cm. This has a solid echogenic worsening with acoustic shadowing, consistent with internal fat. This corresponds with the lesion seen on recent CT comments consistent with a large benign ovarian dermoid. Left ovary Measurements: 4.9 x 2.7 x 2.7 cm. A hyperechoic mass is seen within the ovary measuring 2.6 x 1.6 x 1.8 cm. This is consistent with a small benign ovarian dermoid. Other findings Small amount of simple free fluid noted. IMPRESSION: Bilateral ovarian dermoids measuring 17.8 cm on the right, and 2.6 cm on the left. Normal appearance of uterus. Electronically Signed   By: Earle Gell M.D.   On: 03/14/2017 12:48    Assessment: Bilateral ovarian dermoids, massive right ovarian dermoid, 2700 g estimated weight. Small left ovarian dermoid  Plan: Patient will undergo surgical management with right oophorectomy and left ovarian cystectomy in 2 weeks  Pros and cons of surgical planning have been discussed. I have made the patient aware that it is unlikely that we can salvage any normal ovarian tissue on the patient's right side and may even be required to remove the right fallopian tube. By comparison on the left side the ovarian light is small enough that ovarian preservation is planned. The patient is aware that potential for loss of the entire left ovary is a possibility, but that every reasonable effort will be made to preserve both  left tube and left ovary  .mec 03/14/2017 2:22 PM   By signing my name below, I, Izna Ahmed, attest that this documentation has been prepared under the direction and in the presence of Jonnie Kind, MD. Electronically Signed: Jabier Gauss, ED Scribe. 03/14/17. 2:22 PM.  I personally performed the services described in this documentation, which was SCRIBED in my presence. The recorded information has been reviewed and considered accurate. It has been edited as necessary during review. Jonnie Kind, MD

## 2017-03-15 ENCOUNTER — Ambulatory Visit (HOSPITAL_COMMUNITY): Payer: Self-pay

## 2017-03-22 NOTE — Patient Instructions (Signed)
Kim Zimmerman  03/22/2017     @PREFPERIOPPHARMACY @   Your procedure is scheduled on  03/29/2017   Report to [facility] at  830   A.M.  Call this number if you have problems the morning of surgery:  [phone]   Remember:  Do not eat food or drink liquids after midnight.  Take these medicines the morning of surgery with A SIP OF WATER None   Do not wear jewelry, make-up or nail polish.  Do not wear lotions, powders, or perfumes, or deoderant.  Do not shave 48 hours prior to surgery.  Men may shave face and neck.  Do not bring valuables to the hospital.  [facility] is not responsible for any belongings or valuables.  Contacts, dentures or bridgework may not be worn into surgery.  Leave your suitcase in the car.  After surgery it may be brought to your room.  For patients admitted to the hospital, discharge time will be determined by your treatment team.  Patients discharged the day of surgery will not be allowed to drive home.   Name and phone number of your driver:   family Special instructions:  None  Please read over the following fact sheets that you were given. Surgical Site Infection Prevention, Anesthesia Post-op Instructions and Care and Recovery After Surgery       Unilateral Salpingo-Oophorectomy Unilateral salpingo-oophorectomy is the surgical removal of one fallopian tube and ovary. The ovaries are small organs that produce eggs in women. The fallopian tubes transport the egg from the ovary to the womb (uterus). A unilateral salpingo-oophorectomy may be done for various reasons, including:  Infection in the fallopian tube and ovary.  Scar tissue in the fallopian tube and ovary (adhesions).  A cyst or tumor on the ovary.  A need to remove the fallopian tube and ovary when removing the uterus.  Cancer of the fallopian tube or ovary.  The removal of one fallopian tube and ovary will not prevent you from becoming pregnant, put you  into menopause, or cause problems with your menstrual periods or sex drive. Tell a health care provider about:  Any allergies you have.  All medicines you are taking, including vitamins, herbs, eye drops, creams, and over-the-counter medicines.  Previous problems you or family members have had with the use of anesthetics.  Any blood disorders you have.  Previous surgeries you have had.  Any medical conditions you have. What are the risks? Generally, this is a safe procedure. However, as with any procedure, complications can occur. Possible complications include:  Injury to surrounding organs.  Bleeding.  Infection.  Blood clots in the legs or lungs.  Problems related to anesthesia.  What happens before the procedure?  Ask your health care provider about changing or stopping your regular medicines. You may need to stop taking certain medicines, such as aspirin or blood thinners, at least 1 week before the surgery.  Do not eat or drink anything for at least 8 hours before the surgery.  If you smoke, do not smoke for at least 2 weeks before the surgery.  Make plans to have someone drive you home after the procedure or after your hospital stay. Also arrange for someone to help you with activities during recovery. What happens during the procedure?  You will be given medicine to help you relax before the procedure (sedative). You will then be given medicine to make you sleep through  the procedure (general anesthetic). These medicines will be given through an IV access tube that is put into one of your veins.  Once you are asleep, your lower abdomen will be shaved and cleaned. A thin, flexible tube (catheter) will be placed in your bladder.  The surgeon may use a laparoscopic, robotic, or open technique for this surgery: ? In the laparoscopic technique, the surgery is done through two small cuts (incisions) in the abdomen. A thin, lighted tube with a tiny camera on the end  (laparoscope) is inserted into one of the incisions. The tools needed for the procedure are put through the other incision. ? A robotic technique may be chosen to perform complex surgery in a small space. In the robotic technique, small incisions are made. A camera and surgical instruments are passed through the incisions. Surgical instruments are controlled with the help of a robotic arm. ? In the open technique, the surgery is done through one large incision in the abdomen.  Using any of these techniques, the surgeon will remove the fallopian tube and ovary. The blood vessels will be clamped and tied.  The surgeon will then use staples or stitches to close the incision or incisions. What happens after the procedure?  You will be taken to a recovery area where your progress will be monitored for 1-3 hours. Your blood pressure, pulse, and temperature will be checked often. You will remain in the recovery area until you are stable and waking up.  If the laparoscopic technique was used, you may be allowed to go home after several hours. You may have some shoulder pain. This is normal and usually goes away in a day or two.  If the open technique was used, you will be admitted to the hospital for a couple of days.  You will be given pain medicine as necessary.  The IV tube and catheter will be removed before you are discharged. This information is not intended to replace advice given to you by your health care provider. Make sure you discuss any questions you have with your health care provider. Document Released: 06/13/2009 Document Revised: 07/16/2016 Document Reviewed: 02/07/2013 Elsevier Interactive Patient Education  2018 [place]. Unilateral Salpingo-Oophorectomy, Care After Refer to this sheet in the next few weeks. These instructions provide you with information on caring for yourself after your procedure. Your health care provider may also give you more specific instructions. Your  treatment has been planned according to current medical practices, but problems sometimes occur. Call your health care provider if you have any problems or questions after your procedure. What can I expect after the procedure? After the procedure, it is typical to have the following:  Abdominal pain that can be controlled with pain medicine.  Vaginal spotting.  Constipation.  Follow these instructions at home:  Get plenty of rest and sleep.  Only take over-the-counter or prescription medicines as directed by your health care provider. Do not take aspirin. It can cause bleeding.  Keep incision areas clean and dry. Remove or change any bandages (dressings) only as directed by your health care provider.  Follow your health care provider's advice regarding diet.  Drink enough fluids to keep your urine clear or pale yellow.  Limit exercise and activities as directed by your health care provider. Do not lift anything heavier than 5 pounds (2.3 kg) until your health care provider approves.  Do not drive until your health care provider approves.  Do not drink alcohol until your health care provider  approves.  Do not have sexual intercourse until your health care provider says it is OK.  Take your temperature twice a day and write it down.  If you become constipated, you may: ? Ask your health care provider about taking a mild laxative. ? Add more fruit and bran to your diet. ? Drink more fluids.  Follow up with your health care provider as directed. Contact a health care provider if:  You have swelling or redness in the incision area.  You develop a rash.  You feel lightheaded.  You have pain that is not controlled with medicine.  You have pain, swelling, or redness where the IV access tube was placed. Get help right away if:  You have a fever.  You develop increasing abdominal pain.  You see pus coming out of the incision, or the incision is separating.  You notice a  bad smell coming from the wound or dressing.  You have excessive vaginal bleeding.  You feel sick to your stomach (nauseous) and vomit.  You have leg or chest pain.  You have pain when you urinate.  You develop shortness of breath.  You pass out. This information is not intended to replace advice given to you by your health care provider. Make sure you discuss any questions you have with your health care provider. Document Released: 06/12/2009 Document Revised: 07/16/2016 Document Reviewed: 02/07/2013 Elsevier Interactive Patient Education  2018 [place].  Ovarian Cystectomy Ovarian cystectomy is surgery to remove a fluid-filled sac (cyst) on an ovary. The ovaries are small organs that produce eggs in women. Various types of cysts can form on the ovaries. Most are not cancerous. Surgery may be done if a cyst is large or is causing symptoms such as pain. It may also be done for a cyst that is or might be cancerous. This surgery can be done using a laparoscopic technique or an open abdominal technique. The laparoscopic technique involves smaller cuts (incisions) and a faster recovery time. The technique used will depend on your age, the type of cyst, and whether the cyst is cancerous. The laparoscopic technique is not used for a cancerous cyst. LET [facility] CARE PROVIDER KNOW ABOUT:  Any allergies you have.  All medicines you are taking, including vitamins, herbs, eye drops, creams, and over-the-counter medicines.  Previous problems you or members of your family have had with the use of anesthetics.  Any blood disorders you have.  Previous surgeries you have had.  Medical conditions you have.  Any chance you might be pregnant. RISKS AND COMPLICATIONS Generally, this is a safe procedure. However, as with any procedure, complications can occur. Possible complications include:  Excessive bleeding.  Infection.  Injury to other organs.  Blood clots.  Becoming incapable  of getting pregnant (infertile).  BEFORE THE PROCEDURE  Ask your health care provider about changing or stopping any regular medicines. Avoid taking aspirin, ibuprofen, or blood thinners as directed by your health care provider.  Do not eat or drink anything after midnight the night before surgery.  If you smoke, do not smoke for at least 2 weeks before your surgery.  Do not drink alcohol the day before your surgery.  Let your health care provider know if you develop a cold or any infection before your surgery.  Arrange for someone to drive you home after the procedure or after your hospital stay. Also arrange for someone to help you with activities during recovery. PROCEDURE Either a laparoscopic technique or an open abdominal  technique may be used for this surgery.  Small monitors will be put on your body. They are used to check your heart, blood pressure, and oxygen level.  An IV access tube will be put into one of your veins. Medicine will be able to flow directly into your body through this IV tube.  You might be given a medicine to help you relax (sedative).  You will be given a medicine to make you sleep (general anesthetic). A breathing tube may be placed into your lungs during the procedure.  Laparoscopic Technique  Several small cuts (incisions) are made in your abdomen. These are typically about 1 to 2 cm long.  Your abdomen will be filled with carbon dioxide gas so that it expands. This gives the surgeon more room to operate and makes your organs easier to see.  A thin, lighted tube with a tiny camera on the end (laparoscope) is put through one of the small incisions. The camera on the laparoscope sends a picture to a TV screen in the operating room. This gives the surgeon a good view inside your abdomen.  Hollow tubes are put through the other small incisions in your abdomen. The tools needed for the procedure are put through these tubes.  The ovary with the cyst is  identified, and the cyst is removed. It is sent to the lab for testing. If it is cancer, both ovaries may need to be removed during a different surgery.  Tools are removed. The incisions are then closed with stitches or skin glue, and dressings may be applied. Open Abdominal Technique  A single large incision is made along your bikini line or in the middle of your lower abdomen.  The ovary with the cyst is identified, and the cyst is removed. It is sent to the lab for testing. If it is cancer, both ovaries may need to be removed during a different surgery.  The incision is then closed with stitches or staples. What to expect after the procedure  You will wake up from anesthesia and be taken to a recovery area.  If you had laparoscopic surgery, you may be able to go home the same day, or you may need to stay in the hospital overnight.  If you had open abdominal surgery, you will need to stay in the hospital for a few days.  Your IV access tube and catheter will be removed the first or second day, after you are able to eat and drink enough.  You may be given medicine to relieve pain or to help you sleep.  You may be given an antibiotic medicine if needed. This information is not intended to replace advice given to you by your health care provider. Make sure you discuss any questions you have with your health care provider. Document Released: 06/13/2007 Document Revised: 01/22/2016 Document Reviewed: 03/28/2013 Elsevier Interactive Patient Education  2017 [place]. Ovarian Cystectomy, Care After Refer to this sheet in the next few weeks. These instructions provide you with information on caring for yourself after your procedure. Your health care provider may also give you more specific instructions. Your treatment has been planned according to current medical practices, but problems sometimes occur. Call your health care provider if you have any problems or questions after your  procedure. What can I expect after the procedure? After your procedure, it is typical to have the following:  Pain in your abdomen, especially at the incision sites. You will be given pain medicines to control the  pain.  Tiredness. This is a normal part of the recovery process. Your energy level will return to normal over the next several weeks.  Constipation.  Follow these instructions at home:  Only take over-the-counter or prescription medicines as directed by your health care provider. Avoid taking aspirin because it can cause bleeding.  Follow your health care provider's instructions for when to resume your regular diet, exercise, and activities.  Take rest breaks during the day as needed.  Do not douche or have sexual intercourse until you have permission from your health care provider.  Remove or change any bandages (dressings) as directed by your health care provider.  Do not drive until your health care provider approves.  Take showers instead of baths until your health care provider tells you otherwise.  If you become constipated, you may: ? Use a mild laxative if your health care provider approves. ? Add more fruit and bran to your diet. ? Drink more fluids.  Take your temperature twice a day and record it.  Do not drink alcohol while taking pain medicine.  Try to have someone home with you for the first 1-2 weeks to help with your household activities.  Follow up with your health care provider as directed. Contact a health care provider if:  You have a fever.  You feel sick to your stomach (nauseous) and throw up (vomit).  You have redness, swelling, or leakage of fluid at the incision site.  You have pain when you urinate or have blood in your urine.  You have a rash on your body.  You have pain or redness where the IV tube was inserted.  You have pain that is not relieved with medicine. Get help right away if:  You have chest pain or shortness of  breath.  You feel dizzy or lightheaded.  You have increasing abdominal pain that is not relieved with medicines.  You have pain, swelling, or redness in your leg.  You see a yellowish white fluid (pus) coming from the incision.  Your incision is opening (edges not staying together). This information is not intended to replace advice given to you by your health care provider. Make sure you discuss any questions you have with your health care provider. Document Released: 06/06/2013 Document Revised: 01/22/2016 Document Reviewed: 03/28/2013 Elsevier Interactive Patient Education  2017 [place] Anesthesia, Adult General anesthesia is the use of medicines to make a person "go to sleep" (be unconscious) for a medical procedure. General anesthesia is often recommended when a procedure:  Is long.  Requires you to be still or in an unusual position.  Is major and can cause you to lose blood.  Is impossible to do without general anesthesia.  The medicines used for general anesthesia are called general anesthetics. In addition to making you sleep, the medicines:  Prevent pain.  Control your blood pressure.  Relax your muscles.  Tell a health care provider about:  Any allergies you have.  All medicines you are taking, including vitamins, herbs, eye drops, creams, and over-the-counter medicines.  Any problems you or family members have had with anesthetic medicines.  Types of anesthetics you have had in the past.  Any bleeding disorders you have.  Any surgeries you have had.  Any medical conditions you have.  Any history of heart or lung conditions, such as heart failure, sleep apnea, or chronic obstructive pulmonary disease (COPD).  Whether you are pregnant or may be pregnant.  Whether you use tobacco, alcohol,  marijuana, or street drugs.  Any history of Armed forces logistics/support/administrative officer.  Any history of depression or anxiety. What are the risks? Generally, this is a  safe procedure. However, problems may occur, including:  Allergic reaction to anesthetics.  Lung and heart problems.  Inhaling food or liquids from your stomach into your lungs (aspiration).  Injury to nerves.  Waking up during your procedure and being unable to move (rare).  Extreme agitation or a state of mental confusion (delirium) when you wake up from the anesthetic.  Air in the bloodstream, which can lead to stroke.  These problems are more likely to develop if you are having a major surgery or if you have an advanced medical condition. You can prevent some of these complications by answering all of your health care provider's questions thoroughly and by following all pre-procedure instructions. General anesthesia can cause side effects, including:  Nausea or vomiting  A sore throat from the breathing tube.  Feeling cold or shivery.  Feeling tired, washed out, or achy.  Sleepiness or drowsiness.  Confusion or agitation.  What happens before the procedure? Staying hydrated Follow instructions from your health care provider about hydration, which may include:  Up to 2 hours before the procedure - you may continue to drink clear liquids, such as water, clear fruit juice, black coffee, and plain tea.  Eating and drinking restrictions Follow instructions from your health care provider about eating and drinking, which may include:  8 hours before the procedure - stop eating heavy meals or foods such as meat, fried foods, or fatty foods.  6 hours before the procedure - stop eating light meals or foods, such as toast or cereal.  6 hours before the procedure - stop drinking milk or drinks that contain milk.  2 hours before the procedure - stop drinking clear liquids.  Medicines  Ask your health care provider about: ? Changing or stopping your regular medicines. This is especially important if you are taking diabetes medicines or blood thinners. ? Taking medicines such  as aspirin and ibuprofen. These medicines can thin your blood. Do not take these medicines before your procedure if your health care provider instructs you not to. ? Taking new dietary supplements or medicines. Do not take these during the week before your procedure unless your health care provider approves them.  If you are told to take a medicine or to continue taking a medicine on the day of the procedure, take the medicine with sips of water. General instructions   Ask if you will be going home the same day, the following day, or after a longer hospital stay. ? Plan to have someone take you home. ? Plan to have someone stay with you for the first 24 hours after you leave the hospital or clinic.  For 3-6 weeks before the procedure, try not to use any tobacco products, such as cigarettes, chewing tobacco, and e-cigarettes.  You may brush your teeth on the morning of the procedure, but make sure to spit out the toothpaste. What happens during the procedure?  You will be given anesthetics through a mask and through an IV tube in one of your veins.  You may receive medicine to help you relax (sedative).  As soon as you are asleep, a breathing tube may be used to help you breathe.  An anesthesia specialist will stay with you throughout the procedure. He or she will help keep you comfortable and safe by continuing to give you medicines and adjusting the  amount of medicine that you get. He or she will also watch your blood pressure, pulse, and oxygen levels to make sure that the anesthetics do not cause any problems.  If a breathing tube was used to help you breathe, it will be removed before you wake up. The procedure may vary among health care providers and hospitals. What happens after the procedure?  You will wake up, often slowly, after the procedure is complete, usually in a recovery area.  Your blood pressure, heart rate, breathing rate, and blood oxygen level will be monitored until  the medicines you were given have worn off.  You may be given medicine to help you calm down if you feel anxious or agitated.  If you will be going home the same day, your health care provider may check to make sure you can stand, drink, and urinate.  Your health care providers will treat your pain and side effects before you go home.  Do not drive for 24 hours if you received a sedative.  You may: ? Feel nauseous and vomit. ? Have a sore throat. ? Have mental slowness. ? Feel cold or shivery. ? Feel sleepy. ? Feel tired. ? Feel sore or achy, even in parts of your body where you did not have surgery. This information is not intended to replace advice given to you by your health care provider. Make sure you discuss any questions you have with your health care provider. Document Released: 11/23/2007 Document Revised: 01/27/2016 Document Reviewed: 07/31/2015 Elsevier Interactive Patient Education  2018 [place] Anesthesia, Adult, Care After These instructions provide you with information about caring for yourself after your procedure. Your health care provider may also give you more specific instructions. Your treatment has been planned according to current medical practices, but problems sometimes occur. Call your health care provider if you have any problems or questions after your procedure. What can I expect after the procedure? After the procedure, it is common to have:  Vomiting.  A sore throat.  Mental slowness.  It is common to feel:  Nauseous.  Cold or shivery.  Sleepy.  Tired.  Sore or achy, even in parts of your body where you did not have surgery.  Follow these instructions at home: For at least 24 hours after the procedure:  Do not: ? Participate in activities where you could fall or become injured. ? Drive. ? Use heavy machinery. ? Drink alcohol. ? Take sleeping pills or medicines that cause drowsiness. ? Make important decisions or sign  legal documents. ? Take care of children on your own.  Rest. Eating and drinking  If you vomit, drink water, juice, or soup when you can drink without vomiting.  Drink enough fluid to keep your urine clear or pale yellow.  Make sure you have little or no nausea before eating solid foods.  Follow the diet recommended by your health care provider. General instructions  Have a responsible adult stay with you until you are awake and alert.  Return to your normal activities as told by your health care provider. Ask your health care provider what activities are safe for you.  Take over-the-counter and prescription medicines only as told by your health care provider.  If you smoke, do not smoke without supervision.  Keep all follow-up visits as told by your health care provider. This is important. Contact a health care provider if:  You continue to have nausea or vomiting at home, and medicines are not helpful.  You  cannot drink fluids or start eating again.  You cannot urinate after 8-12 hours.  You develop a skin rash.  You have fever.  You have increasing redness at the site of your procedure. Get help right away if:  You have difficulty breathing.  You have chest pain.  You have unexpected bleeding.  You feel that you are having a life-threatening or urgent problem. This information is not intended to replace advice given to you by your health care provider. Make sure you discuss any questions you have with your health care provider. Document Released: 11/22/2000 Document Revised: 01/19/2016 Document Reviewed: 07/31/2015 Elsevier Interactive Patient Education  [employer].

## 2017-03-24 ENCOUNTER — Other Ambulatory Visit: Payer: Self-pay | Admitting: Obstetrics and Gynecology

## 2017-03-24 ENCOUNTER — Encounter (HOSPITAL_COMMUNITY): Payer: Self-pay

## 2017-03-24 ENCOUNTER — Encounter (HOSPITAL_COMMUNITY)
Admission: RE | Admit: 2017-03-24 | Discharge: 2017-03-24 | Disposition: A | Discharge status: Home / Self Care | Payer: Self-pay | Source: Ambulatory Visit | Attending: Obstetrics and Gynecology | Admitting: Obstetrics and Gynecology

## 2017-03-24 DIAGNOSIS — Z01812 Encounter for preprocedural laboratory examination: Secondary | ICD-10-CM | POA: Insufficient documentation

## 2017-03-24 HISTORY — DX: Major depressive disorder, single episode, unspecified: F32.9

## 2017-03-24 HISTORY — DX: Depression, unspecified: F32.A

## 2017-03-24 LAB — COMPREHENSIVE METABOLIC PANEL
ALK PHOS: 78 U/L (ref 38–126)
ALT: 22 U/L (ref 14–54)
ANION GAP: 7 (ref 5–15)
AST: 19 U/L (ref 15–41)
Albumin: 4.7 g/dL (ref 3.5–5.0)
BILIRUBIN TOTAL: 0.7 mg/dL (ref 0.3–1.2)
BUN: 8 mg/dL (ref 6–20)
CALCIUM: 9.9 mg/dL (ref 8.9–10.3)
CO2: 28 mmol/L (ref 22–32)
Chloride: 105 mmol/L (ref 101–111)
Creatinine, Ser: 0.85 mg/dL (ref 0.44–1.00)
GFR calc non Af Amer: >60 mL/min (ref >60)
Glucose, Bld: 95 mg/dL (ref 65–99)
Potassium: 4.1 mmol/L (ref 3.5–5.1)
Sodium: 140 mmol/L (ref 135–145)
TOTAL PROTEIN: 8.1 g/dL (ref 6.5–8.1)

## 2017-03-24 LAB — CBC
HEMATOCRIT: 34.6 % — AB (ref 36.0–46.0)
HEMOGLOBIN: 11.6 g/dL — AB (ref 12.0–15.0)
MCH: 30.5 pg (ref 26.0–34.0)
MCHC: 33.5 g/dL (ref 30.0–36.0)
MCV: 91.1 fL (ref 78.0–100.0)
Platelets: 295 10*3/uL (ref 150–400)
RBC: 3.8 MIL/uL — ABNORMAL LOW (ref 3.87–5.11)
RDW: 12.5 % (ref 11.5–15.5)
WBC: 4.6 10*3/uL (ref 4.0–10.5)

## 2017-03-24 LAB — TYPE AND SCREEN
ABO/RH(D): O POS
ANTIBODY SCREEN: NEGATIVE

## 2017-03-24 LAB — HCG, SERUM, QUALITATIVE: Preg, Serum: NEGATIVE

## 2017-03-24 NOTE — Progress Notes (Unsigned)
Expand All Collapse All   Preoperative History and Physical  Kim Zimmerman is a 26 y.o. 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  No significant preoperative concerns. Pt has been recently treated for chlamydia., It is too early for peripheral vascular The patient is undecided about future childbearing plans. She is a gravida G3P3003   Proposed surgery: right oophorectomy and left ovarian cystectomy for ovarian dermoid bilateral      Past Medical History:  Diagnosis Date  . Dermoid cyst         Past Surgical History:  Procedure Laterality Date  . BREAST REDUCTION SURGERY                     OB History  Gravida Para Term Preterm AB Living  1         0  SAB TAB Ectopic Multiple Live Births               # Outcome Date GA Lbr Len/2nd Weight Sex Delivery Anes PTL Lv  1 Gravida              Birth Comments: System Generated. Please review and update pregnancy details.    Patient denies any other pertinent gynecologic issues.         Current Outpatient Prescriptions on File Prior to Visit  Medication Sig Dispense Refill  . ibuprofen (ADVIL,MOTRIN) 600 MG tablet Take 1 tablet (600 mg total) by mouth 4 (four) times daily. 30 tablet 0   No current facility-administered medications on file prior to visit.    No Known Allergies  Social History:   reports that she has been smoking Cigarettes.  She has been smoking about 0.25 packs per day. She has never used smokeless tobacco. She reports that she does not drink alcohol or use drugs.       Family History  Problem Relation Age of Onset  . Adopted: Yes    Review of Systems: Noncontributory  PHYSICAL EXAM: There were no vitals taken for this visit. General appearance - alert, well appearing, and in no distress Chest - clear to auscultation, no wheezes, rales or rhonchi, symmetric air entry Heart - normal rate and regular rhythm Abdomen - soft, nontender, nondistended, 24 cm masses to  above umbilicus          Pelvic - examination: uterus is retroverted recent Pap negative for abnormalities with normal GC and chlamydia Extremities - peripheral pulses normal, no pedal edema, no clubbing or cyanosis  Labs:  Imaging Studies:  ImagingResults  US Transvaginal Non-ob  Result Date: 03/14/2017 CLINICAL DATA:  Right lower quadrant pain.  Pelvic mass in female. EXAM: TRANSABDOMINAL AND TRANSVAGINAL ULTRASOUND OF PELVIS TECHNIQUE: Both transabdominal and transvaginal ultrasound examinations of the pelvis were performed. Transabdominal technique was performed for global imaging of the pelvis including uterus, ovaries, adnexal regions, and pelvic cul-de-sac. It was necessary to proceed with endovaginal exam following the transabdominal exam to visualize the endometrium and ovaries. COMPARISON:  CT on 01/15/2017 FINDINGS: Uterus Measurements: 7.9 x 5.0 x 5.2 cm. No fibroids or other mass visualized. Endometrium Thickness: 14 mm.  No focal abnormality visualized. Right ovary Measurements: No normal ovary visualized. A large cystic and solid mass is seen in the right pelvis which measures 17.8 x 9.2 x 16.2 cm. This has a solid echogenic worsening with acoustic shadowing, consistent with internal fat. This corresponds with the lesion seen on recent CT comments consistent with a large benign ovarian dermoid. Left ovary Measurements:  4.9 x 2.7 x 2.7 cm. A hyperechoic mass is seen within the ovary measuring 2.6 x 1.6 x 1.8 cm. This is consistent with a small benign ovarian dermoid. Other findings Small amount of simple free fluid noted. IMPRESSION: Bilateral ovarian dermoids measuring 17.8 cm on the right, and 2.6 cm on the left. Normal appearance of uterus. Electronically Signed   By: Earle Gell M.D.   On: 03/14/2017 12:48   US Pelvis Complete  Result Date: 03/14/2017 CLINICAL DATA:  Right lower quadrant pain.  Pelvic mass in female. EXAM: TRANSABDOMINAL AND TRANSVAGINAL ULTRASOUND OF PELVIS  TECHNIQUE: Both transabdominal and transvaginal ultrasound examinations of the pelvis were performed. Transabdominal technique was performed for global imaging of the pelvis including uterus, ovaries, adnexal regions, and pelvic cul-de-sac. It was necessary to proceed with endovaginal exam following the transabdominal exam to visualize the endometrium and ovaries. COMPARISON:  CT on 01/15/2017 FINDINGS: Uterus Measurements: 7.9 x 5.0 x 5.2 cm. No fibroids or other mass visualized. Endometrium Thickness: 14 mm.  No focal abnormality visualized. Right ovary Measurements: No normal ovary visualized. A large cystic and solid mass is seen in the right pelvis which measures 17.8 x 9.2 x 16.2 cm. This has a solid echogenic worsening with acoustic shadowing, consistent with internal fat. This corresponds with the lesion seen on recent CT comments consistent with a large benign ovarian dermoid. Left ovary Measurements: 4.9 x 2.7 x 2.7 cm. A hyperechoic mass is seen within the ovary measuring 2.6 x 1.6 x 1.8 cm. This is consistent with a small benign ovarian dermoid. Other findings Small amount of simple free fluid noted. IMPRESSION: Bilateral ovarian dermoids measuring 17.8 cm on the right, and 2.6 cm on the left. Normal appearance of uterus. Electronically Signed   By: Earle Gell M.D.   On: 03/14/2017 12:48     Assessment: Bilateral ovarian dermoids, massive right ovarian dermoid, 2700 g estimated weight. Small left ovarian dermoid  Plan: Patient will undergo surgical management with right oophorectomy and left ovarian cystectomy in 2 weeks  Pros and cons of surgical planning have been discussed. I have made the patient aware that it is unlikely that we can salvage any normal ovarian tissue on the patient's right side and may even be required to remove the right fallopian tube. By comparison on the left side the ovarian light is small enough that ovarian preservation is planned. The patient is aware that  potential for loss of the entire left ovary is a possibility, but that every reasonable effort will be made to preserve both left tube and left ovary  .mec 03/14/2017 2:22 PM   By signing my name below, I, Izna Ahmed, attest that this documentation has been prepared under the direction and in the presence of Jonnie Kind, MD. Electronically Signed: Jabier Gauss, ED Scribe. 03/14/17. 2:22 PM.  I personally performed the services described in this documentation, which was SCRIBED in my presence. The recorded information has been reviewed and considered accurate. It has been edited as necessary during review. Jonnie Kind, MD

## 2017-03-28 NOTE — H&P (Addendum)
Preoperative History and Physical  Kim Zimmerman is a 26 y.o. G3P3003 here for surgical management of bilateral ovarian dermoid. The right ovarian dermoid is huge, and ovarian preservation considered unlikely. Effort to preserve the left ovary by ovarian cystectomy are planned.  No significant preoperative concerns. Pt has been recently treated for chlamydia, and pt confirms that she took her medicines.., It is too early for proof of cure.  The patient is undecided about future childbearing plans. She is a gravida G3P3003   Proposed surgery: right oophorectomy and left ovarian cystectomy for ovarian dermoid bilateral      Past Medical History:  Diagnosis Date  . Dermoid cyst         Past Surgical History:  Procedure Laterality Date  . BREAST REDUCTION SURGERY                     OB History  Gravida Para Term Preterm AB Living  1         0  SAB TAB Ectopic Multiple Live Births               # Outcome Date GA Lbr Len/2nd Weight Sex Delivery Anes PTL Lv  1 Gravida              Birth Comments: System Generated. Please review and update pregnancy details.    Patient denies any other pertinent gynecologic issues.         Current Outpatient Prescriptions on File Prior to Visit  Medication Sig Dispense Refill  . ibuprofen (ADVIL,MOTRIN) 600 MG tablet Take 1 tablet (600 mg total) by mouth 4 (four) times daily. 30 tablet 0   No current facility-administered medications on file prior to visit.    No Known Allergies  Social History:   reports that she has been smoking Cigarettes.  She has been smoking about 0.25 packs per day. She has never used smokeless tobacco. She reports that she does not drink alcohol or use drugs.       Family History  Problem Relation Age of Onset  . Adopted: Yes    Review of Systems: Noncontributory  PHYSICAL EXAM: There were no vitals taken for this visit. General appearance - alert, well appearing, and in no  distress Chest - clear to auscultation, no wheezes, rales or rhonchi, symmetric air entry Heart - normal rate and regular rhythm Abdomen - soft, nontender, nondistended, 24 cm masses to above umbilicus          Pelvic - examination: uterus is retroverted recent Pap negative for abnormalities with normal GC and + chlamydia, now treated. Extremities - peripheral pulses normal, no pedal edema, no clubbing or cyanosis  Labs: CBC    Component Value Date/Time   WBC 4.6 03/24/2017 1253   RBC 3.80 (L) 03/24/2017 1253   HGB 11.6 (L) 03/24/2017 1253   HCT 34.6 (L) 03/24/2017 1253   PLT 295 03/24/2017 1253   MCV 91.1 03/24/2017 1253   MCH 30.5 03/24/2017 1253   MCHC 33.5 03/24/2017 1253   RDW 12.5 03/24/2017 1253   LYMPHSABS 2.1 01/15/2017 1036   MONOABS 0.4 01/15/2017 1036   EOSABS 0.1 01/15/2017 1036   BASOSABS 0.0 01/15/2017 1036   BMET    Component Value Date/Time   NA 140 03/24/2017 1253   K 4.1 03/24/2017 1253   CL 105 03/24/2017 1253   CO2 28 03/24/2017 1253   GLUCOSE 95 03/24/2017 1253   BUN 8 03/24/2017 1253   CREATININE 0.85 03/24/2017 1253  CALCIUM 9.9 03/24/2017 1253   GFRNONAA >60 03/24/2017 1253   GFRAA >60 03/24/2017 1253     Imaging Studies:  ImagingResults  US Transvaginal Non-ob  Result Date: 03/14/2017 CLINICAL DATA:  Right lower quadrant pain.  Pelvic mass in female. EXAM: TRANSABDOMINAL AND TRANSVAGINAL ULTRASOUND OF PELVIS TECHNIQUE: Both transabdominal and transvaginal ultrasound examinations of the pelvis were performed. Transabdominal technique was performed for global imaging of the pelvis including uterus, ovaries, adnexal regions, and pelvic cul-de-sac. It was necessary to proceed with endovaginal exam following the transabdominal exam to visualize the endometrium and ovaries. COMPARISON:  CT on 01/15/2017 FINDINGS: Uterus Measurements: 7.9 x 5.0 x 5.2 cm. No fibroids or other mass visualized. Endometrium Thickness: 14 mm.  No focal abnormality  visualized. Right ovary Measurements: No normal ovary visualized. A large cystic and solid mass is seen in the right pelvis which measures 17.8 x 9.2 x 16.2 cm. This has a solid echogenic worsening with acoustic shadowing, consistent with internal fat. This corresponds with the lesion seen on recent CT comments consistent with a large benign ovarian dermoid. Left ovary Measurements: 4.9 x 2.7 x 2.7 cm. A hyperechoic mass is seen within the ovary measuring 2.6 x 1.6 x 1.8 cm. This is consistent with a small benign ovarian dermoid. Other findings Small amount of simple free fluid noted. IMPRESSION: Bilateral ovarian dermoids measuring 17.8 cm on the right, and 2.6 cm on the left. Normal appearance of uterus. Electronically Signed   By: Earle Gell M.D.   On: 03/14/2017 12:48   US Pelvis Complete  Result Date: 03/14/2017 CLINICAL DATA:  Right lower quadrant pain.  Pelvic mass in female. EXAM: TRANSABDOMINAL AND TRANSVAGINAL ULTRASOUND OF PELVIS TECHNIQUE: Both transabdominal and transvaginal ultrasound examinations of the pelvis were performed. Transabdominal technique was performed for global imaging of the pelvis including uterus, ovaries, adnexal regions, and pelvic cul-de-sac. It was necessary to proceed with endovaginal exam following the transabdominal exam to visualize the endometrium and ovaries. COMPARISON:  CT on 01/15/2017 FINDINGS: Uterus Measurements: 7.9 x 5.0 x 5.2 cm. No fibroids or other mass visualized. Endometrium Thickness: 14 mm.  No focal abnormality visualized. Right ovary Measurements: No normal ovary visualized. A large cystic and solid mass is seen in the right pelvis which measures 17.8 x 9.2 x 16.2 cm. This has a solid echogenic worsening with acoustic shadowing, consistent with internal fat. This corresponds with the lesion seen on recent CT comments consistent with a large benign ovarian dermoid. Left ovary Measurements: 4.9 x 2.7 x 2.7 cm. A hyperechoic mass is seen within the ovary  measuring 2.6 x 1.6 x 1.8 cm. This is consistent with a small benign ovarian dermoid. Other findings Small amount of simple free fluid noted. IMPRESSION: Bilateral ovarian dermoids measuring 17.8 cm on the right, and 2.6 cm on the left. Normal appearance of uterus. Electronically Signed   By: Earle Gell M.D.   On: 03/14/2017 12:48     Assessment: Bilateral ovarian dermoids, massive right ovarian dermoid, 2700 g estimated weight. Small left ovarian dermoid  Plan: Patient will undergo surgical management with right oophorectomy and left ovarian cystectomy  Pros and cons of surgical planning have been discussed. I have made the patient aware that it is unlikely that we can salvage any normal ovarian tissue on the patient's right side and may even be required to remove the right fallopian tube. By comparison on the left side the ovarian light is small enough that ovarian preservation is planned. The patient is aware  that potential for loss of the entire left ovary is a possibility, but that every reasonable effort will be made to preserve both left tube and left ovary  .mec 03/14/2017 2:22 PM   By signing my name below, I, Izna Ahmed, attest that this documentation has been prepared under the direction and in the presence of Jonnie Kind, MD. Electronically Signed: Jabier Gauss, ED Scribe. 03/14/17. 2:22 PM.  I personally performed the services described in this documentation, which was SCRIBED in my presence. The recorded information has been reviewed and considered accurate. It has been edited as necessary during review. Jonnie Kind, MD

## 2017-03-29 ENCOUNTER — Observation Stay (HOSPITAL_COMMUNITY)
Admission: RE | Admit: 2017-03-29 | Discharge: 2017-03-30 | Disposition: A | Discharge status: Home / Self Care | Payer: Self-pay | Source: Ambulatory Visit | Attending: Obstetrics and Gynecology | Admitting: Obstetrics and Gynecology

## 2017-03-29 ENCOUNTER — Ambulatory Visit (HOSPITAL_COMMUNITY): Payer: Self-pay | Admitting: Anesthesiology

## 2017-03-29 ENCOUNTER — Encounter (HOSPITAL_COMMUNITY)
Admission: RE | Disposition: A | Discharge status: Home / Self Care | Payer: Self-pay | Source: Ambulatory Visit | Attending: Obstetrics and Gynecology

## 2017-03-29 ENCOUNTER — Encounter (HOSPITAL_COMMUNITY): Payer: Self-pay

## 2017-03-29 DIAGNOSIS — Z8742 Personal history of other diseases of the female genital tract: Secondary | ICD-10-CM

## 2017-03-29 DIAGNOSIS — F17210 Nicotine dependence, cigarettes, uncomplicated: Secondary | ICD-10-CM | POA: Insufficient documentation

## 2017-03-29 DIAGNOSIS — D270 Benign neoplasm of right ovary: Secondary | ICD-10-CM

## 2017-03-29 DIAGNOSIS — D271 Benign neoplasm of left ovary: Secondary | ICD-10-CM

## 2017-03-29 DIAGNOSIS — Z9889 Other specified postprocedural states: Secondary | ICD-10-CM

## 2017-03-29 HISTORY — PX: OVARIAN CYST REMOVAL: SHX89

## 2017-03-29 HISTORY — PX: OOPHORECTOMY: SHX6387

## 2017-03-29 SURGERY — OOPHORECTOMY
Anesthesia: General | Laterality: Right

## 2017-03-29 MED ORDER — KETOROLAC TROMETHAMINE 30 MG/ML IJ SOLN
30.0000 mg | Freq: Once | INTRAMUSCULAR | Status: AC
  Administered 2017-03-29: 30 mg via INTRAVENOUS

## 2017-03-29 MED ORDER — BUPIVACAINE-EPINEPHRINE (PF) 0.5% -1:200000 IJ SOLN
INTRAMUSCULAR | Status: AC
  Filled 2017-03-29: qty 30

## 2017-03-29 MED ORDER — FENTANYL CITRATE (PF) 100 MCG/2ML IJ SOLN
25.0000 ug | INTRAMUSCULAR | Status: DC | PRN
  Administered 2017-03-29 (×2): 50 ug via INTRAVENOUS
  Filled 2017-03-29: qty 2

## 2017-03-29 MED ORDER — ONDANSETRON HCL 4 MG PO TABS: 4.0000 mg | ORAL_TABLET | Freq: Four times a day (QID) | ORAL | Status: DC | PRN

## 2017-03-29 MED ORDER — MIDAZOLAM HCL 2 MG/2ML IJ SOLN
1.0000 mg | INTRAMUSCULAR | Status: DC
  Administered 2017-03-29: 2 mg via INTRAVENOUS

## 2017-03-29 MED ORDER — KETOROLAC TROMETHAMINE 30 MG/ML IJ SOLN
30.0000 mg | Freq: Four times a day (QID) | INTRAMUSCULAR | Status: DC
  Administered 2017-03-29: 30 mg via INTRAVENOUS
  Filled 2017-03-29 (×2): qty 1

## 2017-03-29 MED ORDER — KETOROLAC TROMETHAMINE 30 MG/ML IJ SOLN: 30.0000 mg | Freq: Four times a day (QID) | INTRAMUSCULAR | Status: DC

## 2017-03-29 MED ORDER — BUPIVACAINE-EPINEPHRINE 0.5% -1:200000 IJ SOLN
INTRAMUSCULAR | Status: DC | PRN
  Administered 2017-03-29: 8 mL

## 2017-03-29 MED ORDER — SODIUM CHLORIDE 0.9 % IV SOLN
INTRAVENOUS | Status: DC
  Administered 2017-03-29: 14:00:00 via INTRAVENOUS

## 2017-03-29 MED ORDER — ROCURONIUM BROMIDE 100 MG/10ML IV SOLN
INTRAVENOUS | Status: DC | PRN
  Administered 2017-03-29: 10 mg via INTRAVENOUS
  Administered 2017-03-29: 30 mg via INTRAVENOUS

## 2017-03-29 MED ORDER — OXYCODONE-ACETAMINOPHEN 5-325 MG PO TABS: 1.0000 | ORAL_TABLET | ORAL | Status: DC | PRN

## 2017-03-29 MED ORDER — KETOROLAC TROMETHAMINE 30 MG/ML IJ SOLN
INTRAMUSCULAR | Status: AC
  Filled 2017-03-29: qty 1

## 2017-03-29 MED ORDER — FENTANYL CITRATE (PF) 100 MCG/2ML IJ SOLN
INTRAMUSCULAR | Status: AC
  Filled 2017-03-29: qty 2

## 2017-03-29 MED ORDER — ONDANSETRON HCL 4 MG/2ML IJ SOLN
INTRAMUSCULAR | Status: AC
  Filled 2017-03-29: qty 2

## 2017-03-29 MED ORDER — PANTOPRAZOLE SODIUM 40 MG PO TBEC
40.0000 mg | DELAYED_RELEASE_TABLET | Freq: Every day | ORAL | Status: DC
  Administered 2017-03-30: 40 mg via ORAL
  Filled 2017-03-29 (×2): qty 1

## 2017-03-29 MED ORDER — NALOXONE HCL 0.4 MG/ML IJ SOLN
INTRAMUSCULAR | Status: AC
  Filled 2017-03-29: qty 2

## 2017-03-29 MED ORDER — LACTATED RINGERS IV SOLN
INTRAVENOUS | Status: DC
  Administered 2017-03-29: 11:00:00 via INTRAVENOUS
  Administered 2017-03-29: 1000 mL via INTRAVENOUS
  Administered 2017-03-29: 10:00:00 via INTRAVENOUS

## 2017-03-29 MED ORDER — ONDANSETRON HCL 4 MG/2ML IJ SOLN
4.0000 mg | Freq: Four times a day (QID) | INTRAMUSCULAR | Status: DC | PRN
  Administered 2017-03-29: 4 mg via INTRAVENOUS
  Filled 2017-03-29: qty 2

## 2017-03-29 MED ORDER — IBUPROFEN 600 MG PO TABS: 600.0000 mg | ORAL_TABLET | Freq: Four times a day (QID) | ORAL | Status: DC | PRN

## 2017-03-29 MED ORDER — CEFAZOLIN SODIUM-DEXTROSE 2-3 GM-% IV SOLR
INTRAVENOUS | Status: DC | PRN
  Administered 2017-03-29: 2 g via INTRAVENOUS

## 2017-03-29 MED ORDER — PROPOFOL 10 MG/ML IV BOLUS
INTRAVENOUS | Status: DC | PRN
  Administered 2017-03-29: 150 mg via INTRAVENOUS
  Administered 2017-03-29: 30 mg via INTRAVENOUS

## 2017-03-29 MED ORDER — LIDOCAINE HCL (CARDIAC) 20 MG/ML IV SOLN
INTRAVENOUS | Status: DC | PRN
  Administered 2017-03-29: 40 mg via INTRAVENOUS

## 2017-03-29 MED ORDER — ONDANSETRON HCL 4 MG/2ML IJ SOLN
4.0000 mg | Freq: Once | INTRAMUSCULAR | Status: AC
  Administered 2017-03-29: 4 mg via INTRAVENOUS

## 2017-03-29 MED ORDER — NEOSTIGMINE METHYLSULFATE 10 MG/10ML IV SOLN
INTRAVENOUS | Status: DC | PRN
  Administered 2017-03-29: 4 mg via INTRAVENOUS

## 2017-03-29 MED ORDER — CEFAZOLIN SODIUM-DEXTROSE 2-4 GM/100ML-% IV SOLN
INTRAVENOUS | Status: AC
  Filled 2017-03-29: qty 100

## 2017-03-29 MED ORDER — GLYCOPYRROLATE 0.2 MG/ML IJ SOLN
INTRAMUSCULAR | Status: AC
  Filled 2017-03-29: qty 3

## 2017-03-29 MED ORDER — PROPOFOL 10 MG/ML IV BOLUS
INTRAVENOUS | Status: AC
  Filled 2017-03-29: qty 20

## 2017-03-29 MED ORDER — MIDAZOLAM HCL 2 MG/2ML IJ SOLN
INTRAMUSCULAR | Status: AC
  Filled 2017-03-29: qty 2

## 2017-03-29 MED ORDER — HYDROMORPHONE HCL 1 MG/ML IJ SOLN: 1.0000 mg | INTRAMUSCULAR | Status: DC | PRN

## 2017-03-29 MED ORDER — 0.9 % SODIUM CHLORIDE (POUR BTL) OPTIME
TOPICAL | Status: DC | PRN
  Administered 2017-03-29: 1000 mL

## 2017-03-29 MED ORDER — GLYCOPYRROLATE 0.2 MG/ML IJ SOLN
INTRAMUSCULAR | Status: DC | PRN
  Administered 2017-03-29: 0.6 mg via INTRAVENOUS

## 2017-03-29 MED ORDER — FENTANYL CITRATE (PF) 100 MCG/2ML IJ SOLN
INTRAMUSCULAR | Status: DC | PRN
  Administered 2017-03-29 (×5): 50 ug via INTRAVENOUS
  Administered 2017-03-29 (×2): 25 ug via INTRAVENOUS

## 2017-03-29 SURGICAL SUPPLY — 41 items
BAG HAMPER (MISCELLANEOUS) ×4 IMPLANT
BENZOIN TINCTURE PRP APPL 2/3 (GAUZE/BANDAGES/DRESSINGS) ×4 IMPLANT
CLOSURE WOUND 1/2 X4 (GAUZE/BANDAGES/DRESSINGS) ×3
CLOTH BEACON ORANGE TIMEOUT ST (SAFETY) ×4 IMPLANT
CONT SPEC 4OZ CLIKSEAL STRL BL (MISCELLANEOUS) ×4 IMPLANT
COVER LIGHT HANDLE STERIS (MISCELLANEOUS) ×8 IMPLANT
DRSG OPSITE POSTOP 4X8 (GAUZE/BANDAGES/DRESSINGS) ×4 IMPLANT
DURAPREP 26ML APPLICATOR (WOUND CARE) ×4 IMPLANT
ELECT REM PT RETURN 9FT ADLT (ELECTROSURGICAL) ×4
ELECTRODE REM PT RTRN 9FT ADLT (ELECTROSURGICAL) ×2 IMPLANT
GAUZE SPONGE 4X4 12PLY STRL (GAUZE/BANDAGES/DRESSINGS) ×4 IMPLANT
GLOVE BIOGEL PI IND STRL 7.0 (GLOVE) ×8 IMPLANT
GLOVE BIOGEL PI IND STRL 9 (GLOVE) ×2 IMPLANT
GLOVE BIOGEL PI INDICATOR 7.0 (GLOVE) ×8
GLOVE BIOGEL PI INDICATOR 9 (GLOVE) ×2
GLOVE ECLIPSE 9.0 STRL (GLOVE) ×4 IMPLANT
GOWN SPEC L3 XXLG W/TWL (GOWN DISPOSABLE) ×4 IMPLANT
GOWN STRL REUS W/TWL LRG LVL3 (GOWN DISPOSABLE) ×8 IMPLANT
INST SET MAJOR GENERAL (KITS) ×4 IMPLANT
KIT ROOM TURNOVER APOR (KITS) ×4 IMPLANT
MANIFOLD NEPTUNE II (INSTRUMENTS) ×4 IMPLANT
NS IRRIG 1000ML POUR BTL (IV SOLUTION) ×4 IMPLANT
PACK ABDOMINAL MAJOR (CUSTOM PROCEDURE TRAY) ×4 IMPLANT
PAD ABD 5X9 TENDERSORB (GAUZE/BANDAGES/DRESSINGS) ×4 IMPLANT
PAD ABD 8X10 STRL (GAUZE/BANDAGES/DRESSINGS) ×4 IMPLANT
PAD ARMBOARD 7.5X6 YLW CONV (MISCELLANEOUS) ×4 IMPLANT
SET BASIN LINEN APH (SET/KITS/TRAYS/PACK) ×4 IMPLANT
STRIP CLOSURE SKIN 1/2X4 (GAUZE/BANDAGES/DRESSINGS) ×9 IMPLANT
STRIP CLOSURE SKIN 1X5 (GAUZE/BANDAGES/DRESSINGS) ×3 IMPLANT
STRIP CLOSURE STERI-STRIP 1X5 (GAUZE/BANDAGES/DRESSINGS) ×1
SUT CHROMIC 0 CT 1 (SUTURE) ×8 IMPLANT
SUT CHROMIC 2 0 CT 1 (SUTURE) ×4 IMPLANT
SUT PDS AB CT VIOLET #0 27IN (SUTURE) ×4 IMPLANT
SUT PLAIN CT 1/2CIR 2-0 27IN (SUTURE) ×4 IMPLANT
SUT VIC AB 0 CT1 27 (SUTURE) ×4
SUT VIC AB 0 CT1 27XBRD ANTBC (SUTURE) ×4 IMPLANT
SUT VIC AB 3-0 SH 27 (SUTURE) ×8
SUT VIC AB 3-0 SH 27X BRD (SUTURE) ×8 IMPLANT
SUT VICRYL 4 0 KS 27 (SUTURE) ×4 IMPLANT
TAPE HYPAFIX 4 X30'CHARGABLE (GAUZE/BANDAGES/DRESSINGS) ×4 IMPLANT
TRAY FOLEY W/METER SILVER 16FR (SET/KITS/TRAYS/PACK) ×4 IMPLANT

## 2017-03-29 NOTE — Op Note (Signed)
Please see the brief operative time for surgical details

## 2017-03-29 NOTE — Brief Op Note (Signed)
03/29/2017  11:57 AM  PATIENT:  Kim Zimmerman  26 y.o. female  PRE-OPERATIVE DIAGNOSIS:  large right dermoid cyst, left ovarian small dermoid cyst  POST-OPERATIVE DIAGNOSIS:  large right dermoid cyst, left ovarian small dermoid cyst  PROCEDURE:  Procedure(s): RIGHT OOPHORECTOMY (Right) LEFT OVARIAN CYSTECTOMY (Left)  SURGEON:  Surgeon(s) and Role:    Jonnie Kind, MD - Primary  PHYSICIAN ASSISTANT:   ASSISTANTS: [place] RNFA   ANESTHESIA:   local and general  EBL:  Total I/O In: 1000 [I.V.:1000] Out: 350 [Urine:300; Blood:50]  BLOOD ADMINISTERED:none  DRAINS: Urinary Catheter (Foley)   LOCAL MEDICATIONS USED:  MARCAINE    and Amount: 8 ml  SPECIMEN:  Source of Specimen:  Right ovary with dermoid cyst, left ovarian cyst, (dermoid)  DISPOSITION OF SPECIMEN:  PATHOLOGY  COUNTS:  YES  TOURNIQUET:  * No tourniquets in log *  DICTATION: .Dragon Dictation  PLAN OF CARE: Admit for overnight observation  PATIENT DISPOSITION:  PACU - hemodynamically stable.   Delay start of Pharmacological VTE agent (>24hrs) Zimmerman to surgical blood loss or risk of bleeding: not applicable Details of procedure: Patient was taken operating room prepped and draped for lower abdominal surgery. The mass reached well above the umbilicus so a midline vertical incision was performed approximately 12 cm in length from symphysis pubis cephalad, with sharp dissection to the fascia which was opened and peritoneum identified and opened carefully. There was a seizures was extended cephalad and caudad. The right ovary was a huge simple cystic mass with a smaller portion of it being more hard and firm consistent with a dermoid. The specimen was removed in situ. This peritoneal fluid was extracted for cytology. Omentum was normal and thepatient of tumor elsewhere. The cyst could be pushed out through the abdominal incision, barely and was identified as a cystic structure entirely within the right ovary. No  normal tissue could be identified. Cystectomy was performed by first identifying the cells, negative the ovary from the utero-ovarian ligament and doubly clamping this ligament transecting it and suture ligating. The attachments to the mesosalpinx were then identified and cautery used carefully well away from the fimbria to free the tube its attachments to the ovary. The vessels that made of the infundibulopelvic attachments to the ovary were then clamped cut and suture ligated remaining close to the ovarian tumor and well away from the pelvic sidewall these were ligated as well and specimen passed off the table. Pedicles were inspected and confirmed as hemostatic. Left ovarian cystectomy the uterus was inspected and was visually normal and palpably normal, and the left fallopian tube was similarly normal. The left ovary appeared normal to appearance, min, minimally enlarged but on palpation could be identified that deep within the structure there was a nodule approximately 1.5 cm in diameter. A arcuate incision was made on the medial aspect of the ovary overlying the mass and a suspected ovarian dermoid cyst shelled out of the center of the ovary. Care was taken to minimize bleeding by point cautery within the cortical within the cavity used minimally but then the potential space closed by pursestring continuous running suture throughout the cavity reducing the potential space and then subcuticular closure of the very and cortex, using the same 3-0 Vicryl suture the area on either side of the ovary was then infiltrated with Marcaine with epinephrine and the ovary held with light pressure for 2 minutes and then observed for an additional 2 minutes with no further bleeding from the surgical biopsy  site pelvis was irrigated. Pedicles were inspected. Hemostasis was confirmed. The laparotomy equipment was removed, the peritoneum closed with running 2-0 chromic, exteriorizing the cut edges, then an 0 PDS used in  continuous running fashion to close fashion. Subcuticular running mattress type sutures were used to close the subcutaneous fatty tissue space and then subcuticular 4-0 Vicryl running suture used to close the skin edges with good tissue edge approximation. Pressure dressing will be applied. Patient recovery room in stable condition operative time 40 minutes

## 2017-03-29 NOTE — Anesthesia Postprocedure Evaluation (Signed)
Anesthesia Post Note  Patient: Kim Zimmerman  Procedure(s) Performed: Procedure(s) (LRB): RIGHT OOPHORECTOMY (Right) LEFT OVARIAN CYSTECTOMY (Left)  Patient location during evaluation: PACU Anesthesia Type: General Level of consciousness: oriented and awake Pain management: pain level controlled Vital Signs Assessment: post-procedure vital signs reviewed and stable Respiratory status: spontaneous breathing, respiratory function stable and non-rebreather facemask Cardiovascular status: stable Postop Assessment: no signs of nausea or vomiting Anesthetic complications: no     Last Vitals:  Vitals:   03/29/17 1209 03/29/17 1215  BP: 119/85 136/73  Pulse: 62 62  Resp: 18 17  Temp: 36.7 C     Last Pain:  Vitals:   03/29/17 0903  TempSrc: Oral                 Emsley Custer A

## 2017-03-29 NOTE — Anesthesia Preprocedure Evaluation (Signed)
Anesthesia Evaluation  Patient identified by MRN, date of birth, ID band Patient awake    Reviewed: Allergy & Precautions, NPO status , Patient's Chart, lab work & pertinent test results  Airway Mallampati: I  TM Distance: >3 FB Neck ROM: Full    Dental  (+) Teeth Intact   Pulmonary former smoker,    breath sounds clear to auscultation       Cardiovascular negative cardio ROS   Rhythm:Regular Rate:Normal     Neuro/Psych PSYCHIATRIC DISORDERS Depression    GI/Hepatic negative GI ROS, Neg liver ROS,   Endo/Other  negative endocrine ROS  Renal/GU negative Renal ROS     Musculoskeletal negative musculoskeletal ROS (+)   Abdominal   Peds  Hematology negative hematology ROS (+)   Anesthesia Other Findings   Reproductive/Obstetrics                             Anesthesia Physical Anesthesia Plan  ASA: II  Anesthesia Plan: General   Post-op Pain Management:    Induction: Intravenous  PONV Risk Score and Plan:   Airway Management Planned: Oral ETT  Additional Equipment:   Intra-op Plan:   Post-operative Plan: Extubation in OR  Informed Consent: I have reviewed the patients History and Physical, chart, labs and discussed the procedure including the risks, benefits and alternatives for the proposed anesthesia with the patient or authorized representative who has indicated his/her understanding and acceptance.     Plan Discussed with:   Anesthesia Plan Comments:         Anesthesia Quick Evaluation

## 2017-03-29 NOTE — Interval H&P Note (Signed)
History and Physical Interval Note:  03/29/2017 9:58 AM  Kim Zimmerman  has presented today for surgery, with the diagnosis of Bilateral Ovarian Dermoids  The various methods of treatment have been discussed with the patient and family. After consideration of risks, benefits and other options for treatment, the patient has consented to  Procedure(s): OOPHORECTOMY (Right) OVARIAN CYSTECTOMY (Left) as a surgical intervention .  The patient's history has been reviewed, patient examined, no change in status, stable for surgery.  I have reviewed the patient's chart and labs.  Questions were answered to the patient's satisfaction.     Jonnie Kind

## 2017-03-29 NOTE — Anesthesia Procedure Notes (Signed)
Procedure Name: Intubation Date/Time: 03/29/2017 10:46 AM Performed by: Andree Elk, Jonthan Leite A Pre-anesthesia Checklist: Patient identified, Patient being monitored, Timeout performed, Emergency Drugs available and Suction available Patient Re-evaluated:Patient Re-evaluated prior to induction Oxygen Delivery Method: Circle System Utilized Preoxygenation: Pre-oxygenation with 100% oxygen Induction Type: IV induction Ventilation: Mask ventilation without difficulty Laryngoscope Size: Miller and 3 Grade View: Grade I Tube type: Oral Tube size: 7.0 mm Number of attempts: 1 Airway Equipment and Method: Stylet Placement Confirmation: ETT inserted through vocal cords under direct vision,  positive ETCO2 and breath sounds checked- equal and bilateral Secured at: 21 cm Tube secured with: Tape Dental Injury: Teeth and Oropharynx as per pre-operative assessment

## 2017-03-29 NOTE — Transfer of Care (Signed)
Immediate Anesthesia Transfer of Care Note  Patient: Kim Zimmerman  Procedure(s) Performed: Procedure(s): RIGHT OOPHORECTOMY (Right) LEFT OVARIAN CYSTECTOMY (Left)  Patient Location: PACU  Anesthesia Type:General  Level of Consciousness: drowsy and patient cooperative  Airway & Oxygen Therapy: Patient Spontanous Breathing and Patient connected to face mask oxygen  Post-op Assessment: Report given to RN and Post -op Vital signs reviewed and stable  Post vital signs: Reviewed and stable  Last Vitals:  Vitals:   03/29/17 0903 03/29/17 0910  BP: (!) 89/58 (!) 94/54  Pulse: 68   Resp: 18   Temp: 36.6 C     Last Pain:  Vitals:   03/29/17 0903  TempSrc: Oral         Complications: No apparent anesthesia complications

## 2017-03-30 LAB — BASIC METABOLIC PANEL
Anion gap: 6 (ref 5–15)
BUN: 7 mg/dL (ref 6–20)
CHLORIDE: 108 mmol/L (ref 101–111)
CO2: 24 mmol/L (ref 22–32)
CREATININE: 0.63 mg/dL (ref 0.44–1.00)
Calcium: 9 mg/dL (ref 8.9–10.3)
GFR calc Af Amer: >60 mL/min (ref >60)
GFR calc non Af Amer: >60 mL/min (ref >60)
Glucose, Bld: 88 mg/dL (ref 65–99)
Potassium: 3.8 mmol/L (ref 3.5–5.1)
Sodium: 138 mmol/L (ref 135–145)

## 2017-03-30 LAB — CBC
HEMATOCRIT: 29 % — AB (ref 36.0–46.0)
HEMOGLOBIN: 9.6 g/dL — AB (ref 12.0–15.0)
MCH: 30.1 pg (ref 26.0–34.0)
MCHC: 33.1 g/dL (ref 30.0–36.0)
MCV: 90.9 fL (ref 78.0–100.0)
Platelets: 246 10*3/uL (ref 150–400)
RBC: 3.19 MIL/uL — ABNORMAL LOW (ref 3.87–5.11)
RDW: 12.3 % (ref 11.5–15.5)
WBC: 11.5 10*3/uL — ABNORMAL HIGH (ref 4.0–10.5)

## 2017-03-30 MED ORDER — IBUPROFEN 600 MG PO TABS: 600.0000 mg | ORAL_TABLET | Freq: Four times a day (QID) | ORAL | 1 refills | Status: DC

## 2017-03-30 MED ORDER — DOCUSATE SODIUM 100 MG PO CAPS: 100.0000 mg | ORAL_CAPSULE | Freq: Two times a day (BID) | ORAL | 2 refills | Status: DC | PRN

## 2017-03-30 MED ORDER — TRAMADOL HCL 50 MG PO TABS: 50.0000 mg | ORAL_TABLET | Freq: Four times a day (QID) | ORAL | 0 refills | Status: DC | PRN

## 2017-03-30 NOTE — Progress Notes (Signed)
Patient discharged home.  IV removed, WNL.  Reviewed DC instructions and medications.  Instructed to follow up with PCP.  Reviewed s/s of SSI infection and when to call MD. No questions at this time.  Verbalizes understanding.  Assisted off unit in NAD

## 2017-03-30 NOTE — Discharge Summary (Signed)
Physician Discharge Summary  Patient ID: Kim Zimmerman MRN: [id number] DOB/AGE: May 13, 1991 26 y.o.  Admit date: 03/29/2017 Discharge date: 03/30/2017  Admission Diagnoses:BiLateral ovarian dermoid cyst  Discharge Diagnoses:  Active Problems:   Bilateral dermoid cysts of ovaries   S/P ovarian cystectomy Oophorectomy right  Discharged Condition: good  Hospital Course: This 26 year old female was admitted through day surgery for bilateral movable of dermoid cyst. This consisted of laparotomy with right oophorectomy and left ovarian cystectomy removing a huge 20 cm right ovary and a small 2 cm cyst from the deep portions of the left ovary with ovarian preservation fallopian tubes were preserved bilaterally. She had a straightforward postoperative care and was stable for discharge on postoperative day 1 tolerating pain well with ibuprofen and oral pain meds  Consults: None  Significant Diagnostic Studies: labs:  CBC Latest Ref Rng & Units 03/30/2017 03/24/2017 01/15/2017  WBC 4.0 - 10.5 K/uL 11.5(H) 4.6 5.1  Hemoglobin 12.0 - 15.0 g/dL 9.6(L) 11.6(L) 10.8(L)  Hematocrit 36.0 - 46.0 % 29.0(L) 34.6(L) 32.2(L)  Platelets 150 - 400 K/uL 246 295 240     Treatments: surgery: Right oophorectomy ,, left ovarian cystectomy  Discharge Exam: Blood pressure (!) 95/49, pulse 73, temperature 98.9 F (37.2 C), temperature source Oral, resp. rate 18, height 5\' 4"  (1.626 m), weight 138 lb (62.6 kg), last menstrual period 03/07/2017, SpO2 100 %. General appearance: alert, cooperative and no distress Head: Normocephalic, without obvious abnormality, atraumatic Resp: clear to auscultation bilaterally GI: normal findings: bowel sounds normal, no organomegaly and No guarding or rebound. Dressing clean dry honeycomb dressing in place Legs nontender  Disposition: 01-Home or Self Care  Discharge Instructions    Call MD for:  persistant nausea and vomiting    Complete by:  As directed    Call MD for:  severe  uncontrolled pain    Complete by:  As directed    Call MD for:  temperature >100.4    Complete by:  As directed    Diet - low sodium heart healthy    Complete by:  As directed    Discharge instructions    Complete by:  As directed    See discharge instructions on AVS   Increase activity slowly    Complete by:  As directed      Allergies as of 03/30/2017      Reactions   Hydrocodone Nausea And Vomiting      Medication List    TAKE these medications   docusate sodium 100 MG capsule Commonly known as:  COLACE Take 1 capsule (100 mg total) by mouth 2 (two) times daily as needed.   ibuprofen 600 MG tablet Commonly known as:  ADVIL,MOTRIN Take 1 tablet (600 mg total) by mouth 4 (four) times daily.   traMADol 50 MG tablet Commonly known as:  ULTRAM Take 1 tablet (50 mg total) by mouth every 6 (six) hours as needed for moderate pain or severe pain.      Follow-up Information    Jonnie Kind, MD Follow up in 2 week(s).   Specialties:  Obstetrics and Gynecology, Radiology Contact information: [address] [place] [address] [phone]           Signed: Jonnie Kind 03/30/2017, 8:19 AM

## 2017-03-30 NOTE — Progress Notes (Signed)
1 Day Post-Op Procedure(s) (LRB): RIGHT OOPHORECTOMY (Right) LEFT OVARIAN CYSTECTOMY (Left)  Subjective: Patient reports incisional pain, tolerating PO and no problems voiding.    Objective: I have reviewed patient's vital signs, intake and output, medications and labs. CBC CBC Latest Ref Rng & Units 03/30/2017 03/24/2017 01/15/2017  WBC 4.0 - 10.5 K/uL 11.5(H) 4.6 5.1  Hemoglobin 12.0 - 15.0 g/dL 9.6(L) 11.6(L) 10.8(L)  Hematocrit 36.0 - 46.0 % 29.0(L) 34.6(L) 32.2(L)  Platelets 150 - 400 K/uL 246 295 240    General: alert, cooperative and no distress Resp: clear to auscultation bilaterally GI: soft, non-tender; bowel sounds normal; no masses,  no organomegaly Vaginal Bleeding: none  Assessment: s/p Procedure(s): RIGHT OOPHORECTOMY (Right) LEFT OVARIAN CYSTECTOMY (Left): stable  Plan: Advance diet Encourage ambulation Discharge home  LOS: 0 days    Ronae Noell V 03/30/2017, 8:14 AM

## 2017-03-30 NOTE — Care Management Note (Signed)
Case Management Note  Patient Details  Name: Kim Zimmerman MRN: [id number] Date of Birth: 02-05-1991  Subjective/Objective:                  S/p laprotomy with right oophorectomy and ovarian cystectomy. Chart revived for CM needs. Pt has no insurance and no PCP. She will f.u with GYN. No med assistance available for Rx.   Action/Plan: Pt discharging home today with self care. Pt provided list of places to establish PCP care in [facility] with no insurance.   Expected Discharge Date:  03/30/17               Expected Discharge Plan:  Home/Self Care  In-House Referral:  NA  Discharge planning Services  CM Consult  Post Acute Care Choice:  NA Choice offered to:  NA  Status of Service:  Completed, signed off  Sherald Barge, RN 03/30/2017, 9:11 AM

## 2017-03-30 NOTE — Discharge Instructions (Signed)
Ovarian Cystectomy Ovarian cystectomy is surgery to remove a fluid-filled sac (cyst) on an ovary. The ovaries are small organs that produce eggs in women. Various types of cysts can form on the ovaries. Most are not cancerous. Surgery may be done if a cyst is large or is causing symptoms such as pain. It may also be done for a cyst that is or might be cancerous. This surgery can be done using a laparoscopic technique or an open abdominal technique. The laparoscopic technique involves smaller cuts (incisions) and a faster recovery time. The technique used will depend on your age, the type of cyst, and whether the cyst is cancerous. The laparoscopic technique is not used for a cancerous cyst. LET YOUR HEALTH CARE PROVIDER KNOW ABOUT:  Any allergies you have.  All medicines you are taking, including vitamins, herbs, eye drops, creams, and over-the-counter medicines.  Previous problems you or members of your family have had with the use of anesthetics.  Any blood disorders you have.  Previous surgeries you have had.  Medical conditions you have.  Any chance you might be pregnant. RISKS AND COMPLICATIONS Generally, this is a safe procedure. However, as with any procedure, complications can occur. Possible complications include:  Excessive bleeding.  Infection.  Injury to other organs.  Blood clots.  Becoming incapable of getting pregnant (infertile).  BEFORE THE PROCEDURE  Ask your health care provider about changing or stopping any regular medicines. Avoid taking aspirin, ibuprofen, or blood thinners as directed by your health care provider.  Do not eat or drink anything after midnight the night before surgery.  If you smoke, do not smoke for at least 2 weeks before your surgery.  Do not drink alcohol the day before your surgery.  Let your health care provider know if you develop a cold or any infection before your surgery.  Arrange for someone to drive you home after the  procedure or after your hospital stay. Also arrange for someone to help you with activities during recovery. PROCEDURE Either a laparoscopic technique or an open abdominal technique may be used for this surgery.  Small monitors will be put on your body. They are used to check your heart, blood pressure, and oxygen level.  An IV access tube will be put into one of your veins. Medicine will be able to flow directly into your body through this IV tube.  You might be given a medicine to help you relax (sedative).  You will be given a medicine to make you sleep (general anesthetic). A breathing tube may be placed into your lungs during the procedure.  Laparoscopic Technique  Several small cuts (incisions) are made in your abdomen. These are typically about 1 to 2 cm long.  Your abdomen will be filled with carbon dioxide gas so that it expands. This gives the surgeon more room to operate and makes your organs easier to see.  A thin, lighted tube with a tiny camera on the end (laparoscope) is put through one of the small incisions. The camera on the laparoscope sends a picture to a TV screen in the operating room. This gives the surgeon a good view inside your abdomen.  Hollow tubes are put through the other small incisions in your abdomen. The tools needed for the procedure are put through these tubes.  The ovary with the cyst is identified, and the cyst is removed. It is sent to the lab for testing. If it is cancer, both ovaries may need to be removed during   a different surgery.  Tools are removed. The incisions are then closed with stitches or skin glue, and dressings may be applied. Open Abdominal Technique  A single large incision is made along your bikini line or in the middle of your lower abdomen.  The ovary with the cyst is identified, and the cyst is removed. It is sent to the lab for testing. If it is cancer, both ovaries may need to be removed during a different surgery.  The  incision is then closed with stitches or staples. What to expect after the procedure  You will wake up from anesthesia and be taken to a recovery area.  If you had laparoscopic surgery, you may be able to go home the same day, or you may need to stay in the hospital overnight.  If you had open abdominal surgery, you will need to stay in the hospital for a few days.  Your IV access tube and catheter will be removed the first or second day, after you are able to eat and drink enough.  You may be given medicine to relieve pain or to help you sleep.  You may be given an antibiotic medicine if needed. This information is not intended to replace advice given to you by your health care provider. Make sure you discuss any questions you have with your health care provider. Document Released: 06/13/2007 Document Revised: 01/22/2016 Document Reviewed: 03/28/2013 Elsevier Interactive Patient Education  2017 [employer]vier Inc.  

## 2017-04-13 ENCOUNTER — Encounter: Payer: Self-pay | Admitting: Obstetrics and Gynecology

## 2017-04-13 ENCOUNTER — Ambulatory Visit (INDEPENDENT_AMBULATORY_CARE_PROVIDER_SITE_OTHER): Payer: Self-pay | Admitting: Obstetrics and Gynecology

## 2017-04-13 VITALS — BP 100/64 | HR 67 | Ht 64.0 in | Wt 140.5 lb

## 2017-04-13 DIAGNOSIS — Z9889 Other specified postprocedural states: Secondary | ICD-10-CM

## 2017-04-13 DIAGNOSIS — Z4889 Encounter for other specified surgical aftercare: Secondary | ICD-10-CM

## 2017-04-13 NOTE — Progress Notes (Signed)
   Subjective:  Kim Zimmerman is a 26 y.o. female now 2 weeks status post right oophorectomy and Left ovarian cystectomy.    She is now 2 weeks out and is doing well except for some pulling sensation on the right as she is up and about Review of Systems Negative except as above   Diet:   Regular   Bowel movements : normal.  Pain is described as a pulling sensation only with activity and upright position  Objective:  BP 100/64 (BP Location: Right Arm, Patient Position: Sitting, Cuff Size: Normal)   Pulse 67   Ht 5\' 4"  (1.626 m)   Wt 140 lb 8 oz (63.7 kg)   LMP 02/28/2017 (Exact Date)   BMI 24.12 kg/m  General:Well developed, well nourished.  No acute distress. Abdomen: Bowel sounds normal, soft, non-tender. P Incision(s):   Healing Well, no drainage, no erythema, no hernia, no swelling, no dehiscence, patient to keep Band-Aid over the upper part where she said it was previously minimally pulled apart but today's exam appears normal     Assessment:  Post-Op 2 weeks s/p right oophorectomy   And left ovarian cystectomy Stable recovery postoperatively.   Plan:  1.Wound care discussed   2. . current medications.None added 3. Activity restrictions: desk job only 4. return to work: 2-3 weeks. 5. Follow up in 2 weeks.

## 2017-04-27 ENCOUNTER — Encounter: Payer: Self-pay | Admitting: *Deleted

## 2017-04-27 ENCOUNTER — Encounter: Payer: Self-pay | Admitting: Obstetrics and Gynecology

## 2017-07-23 IMAGING — US US PELVIS COMPLETE
1 series · 13 of 25 positions shown · non-contrast
Comparison: CT on 01/15/2017

CLINICAL DATA: Right lower quadrant pain.  Pelvic mass in female.

EXAM:
TRANSABDOMINAL AND TRANSVAGINAL ULTRASOUND OF PELVIS
TECHNIQUE: Both transabdominal and transvaginal ultrasound examinations of the
pelvis were performed. Transabdominal technique was performed for
global imaging of the pelvis including uterus, ovaries, adnexal
regions, and pelvic cul-de-sac. It was necessary to proceed with
endovaginal exam following the transabdominal exam to visualize the
endometrium and ovaries.

[Series 1: us pelvis complete · 0.24mm/px · 13 of 98 slices shown]
[im 1/98]
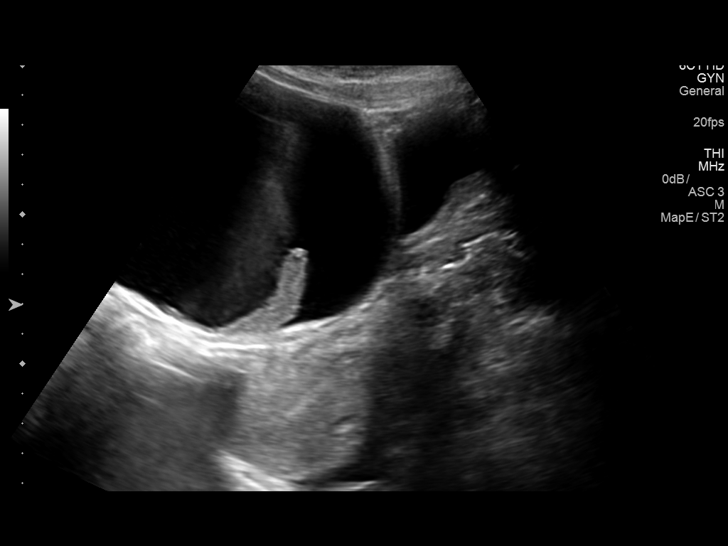
[im 9/98]
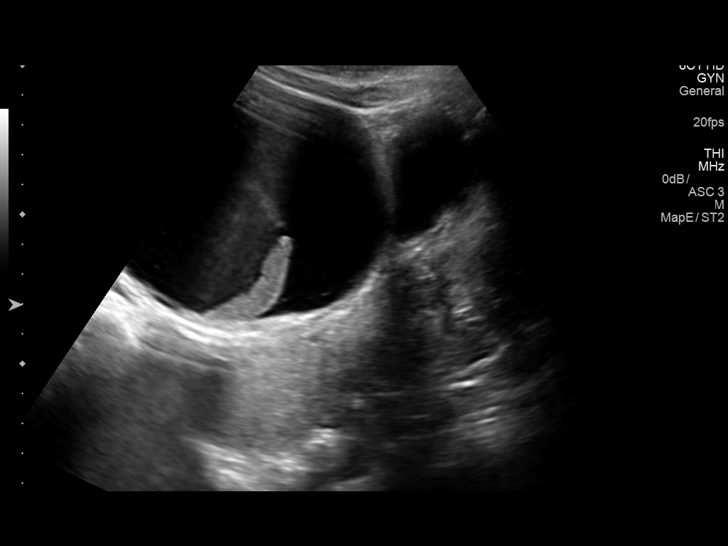
[im 17/98]
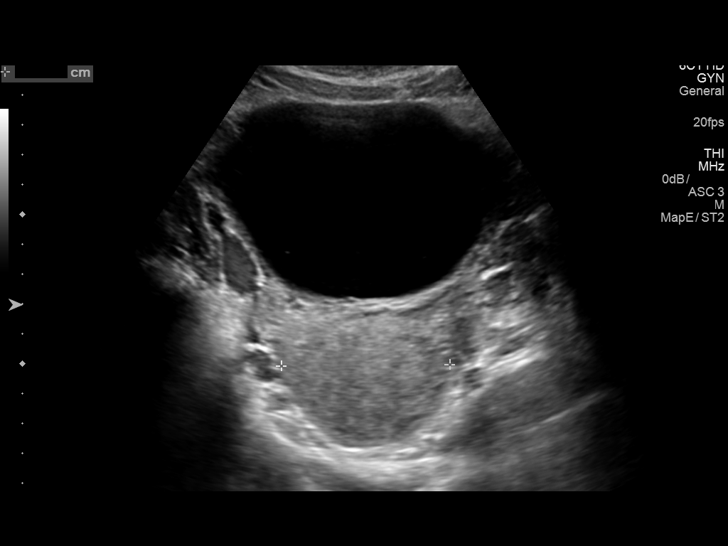
[im 25/98]
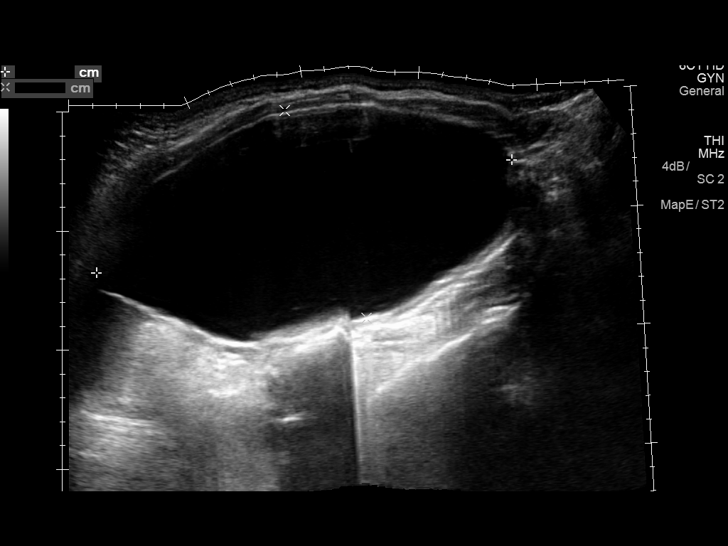
[im 33/98]
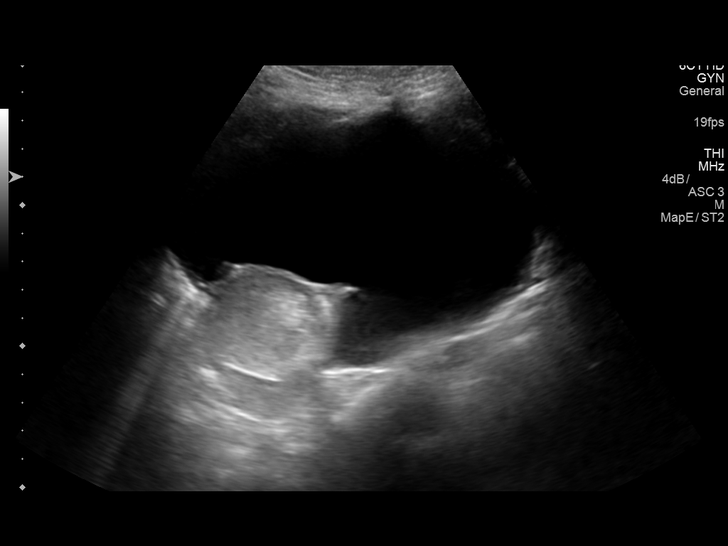
[im 41/98]
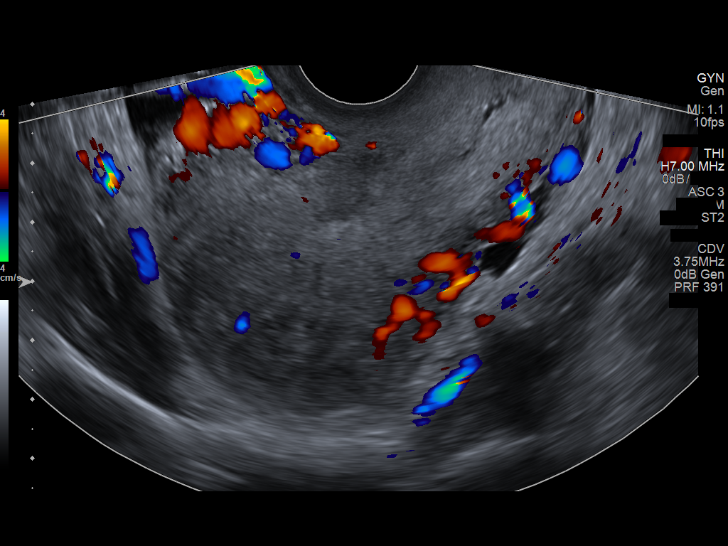
[im 49/98]
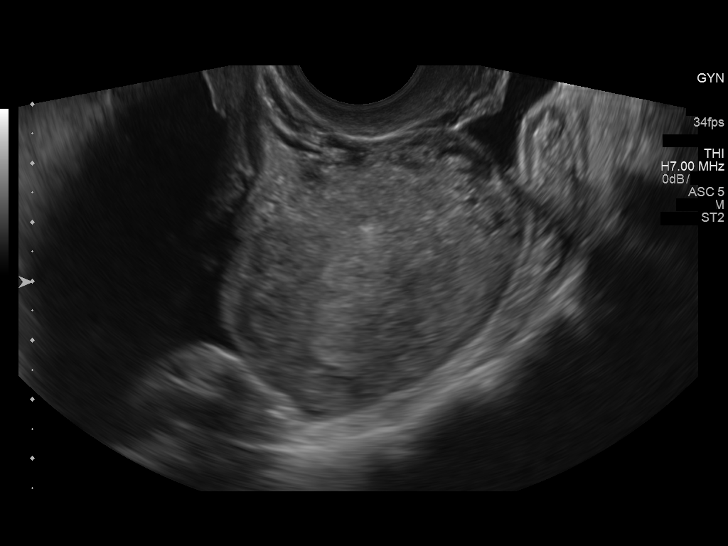
[im 57/98]
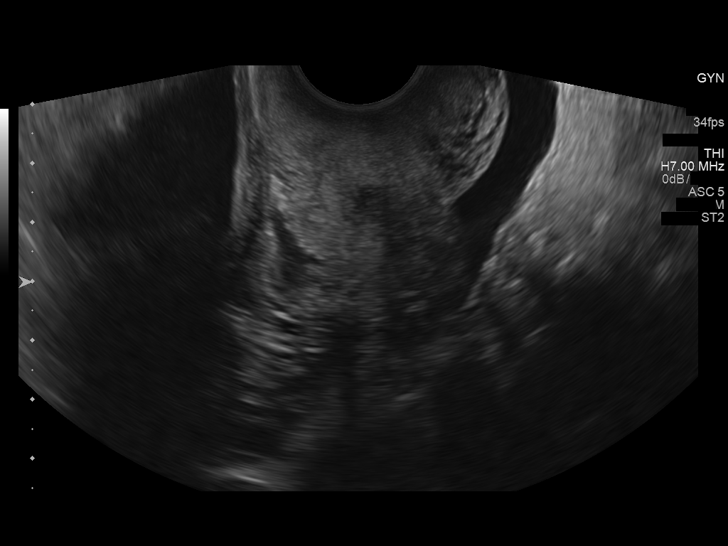
[im 65/98]
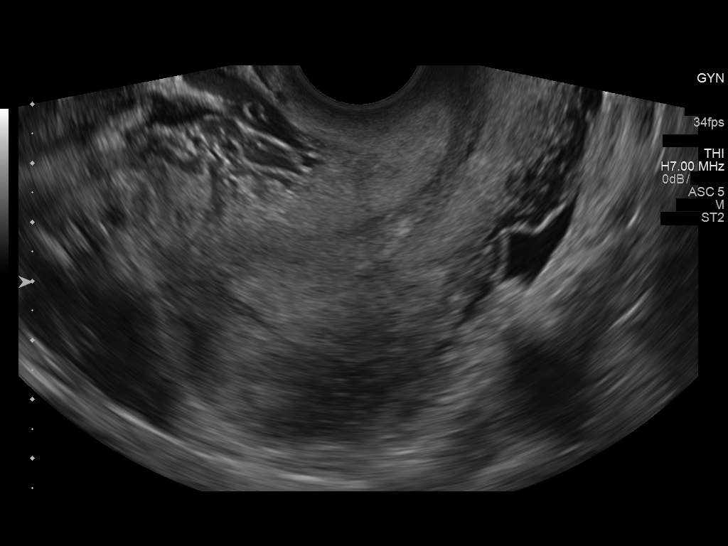
[im 73/98]
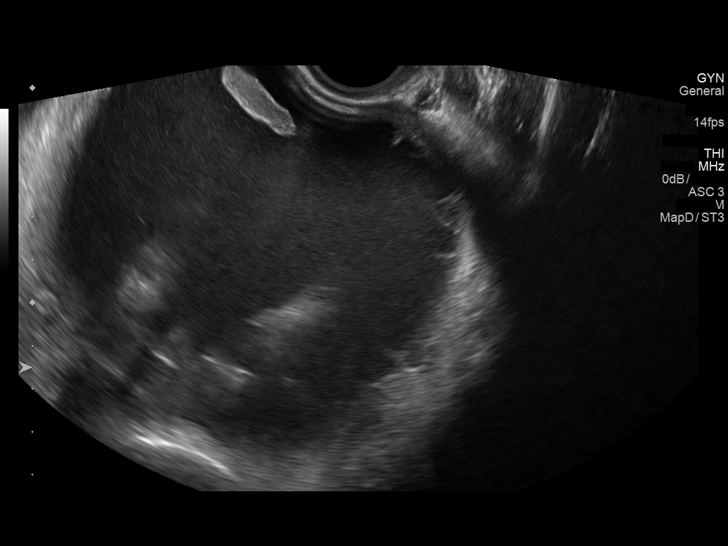
[im 81/98]
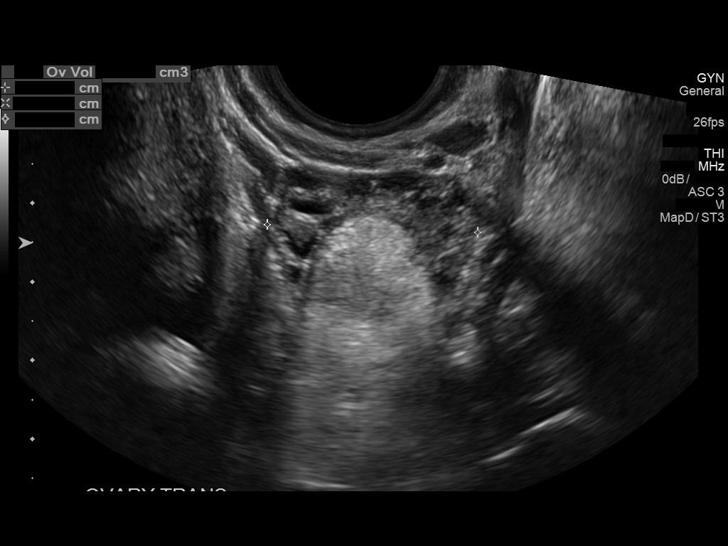
[im 89/98]
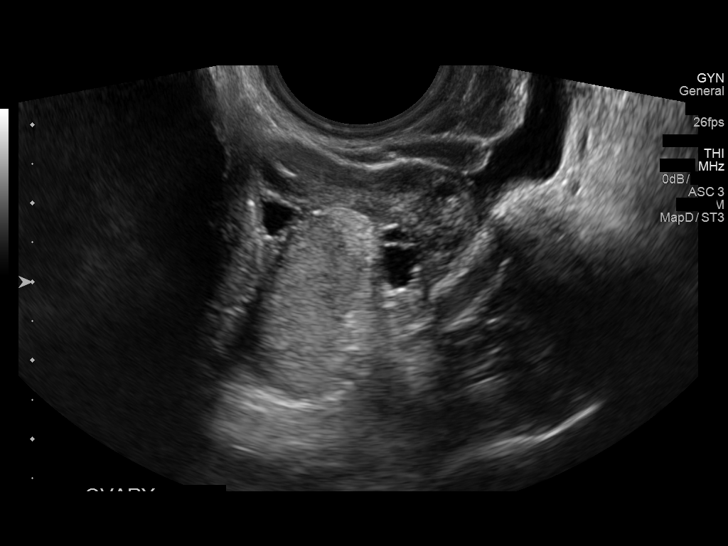
[im 98/98]
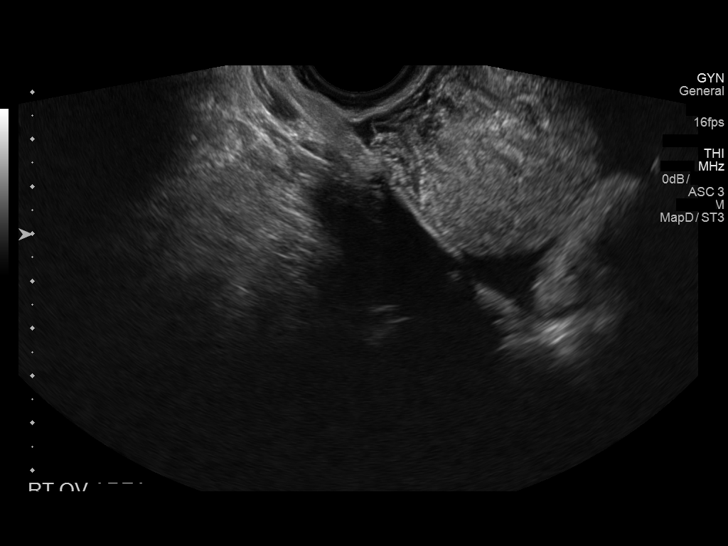

[13 of 25 positions shown; findings below may reference images not displayed]

FINDINGS: Uterus

Measurements: 7.9 x 5.0 x 5.2 cm. No fibroids or other mass
visualized.

Endometrium

Thickness: 14 mm.  No focal abnormality visualized.

Right ovary

Measurements: No normal ovary visualized. A large cystic and solid
mass is seen in the right pelvis which measures 17.8 x 9.2 x
cm. This has a solid echogenic worsening with acoustic shadowing,
consistent with internal fat. This corresponds with the lesion seen
on recent CT comments consistent with a large benign ovarian
dermoid.

Left ovary

Measurements: 4.9 x 2.7 x 2.7 cm. A hyperechoic mass is seen within
the ovary measuring 2.6 x 1.6 x 1.8 cm. This is consistent with a
small benign ovarian dermoid.

Other findings

Small amount of simple free fluid noted.
IMPRESSION: Bilateral ovarian dermoids measuring 17.8 cm on the right, and
cm on the left.

Normal appearance of uterus.

## 2017-08-05 IMAGING — CT CT ABD-PELV W/ CM
2 of 3 series · 16 of 46 positions shown, 18 images · IV contrast (iopamidol)
Comparison: Pelvic ultrasound 03/06/2016.

CLINICAL DATA: Abdominal pain for 2 months. Palpable mass in the
right lower quadrant. Intermittent diarrhea.

EXAM:
CT ABDOMEN AND PELVIS WITH CONTRAST
TECHNIQUE: Multidetector CT imaging of the abdomen and pelvis was performed
using the standard protocol following bolus administration of
intravenous contrast.
CONTRAST:  100mL [id number] IOPAMIDOL ([id number]) INJECTION 61%

[Series 2: axial st · axial · 0.68mm/px · z∈[+813,+1163]mm · 13 of 82 slices shown, 15 images]
[im 6/82  soft-tissue]
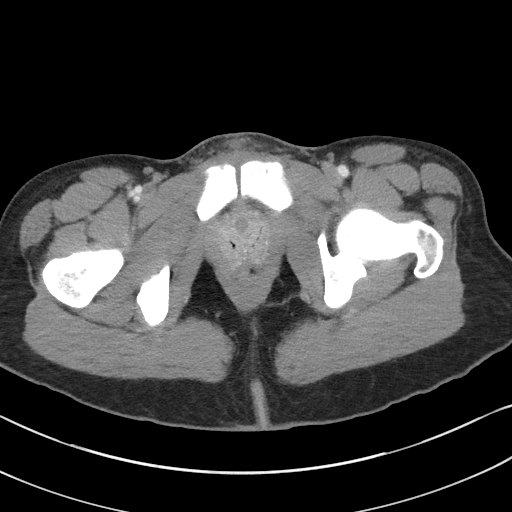
[im 6/82  bone]
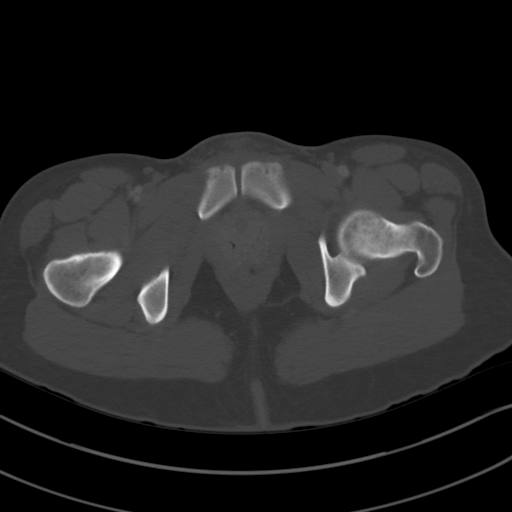
[im 11/82  soft-tissue]
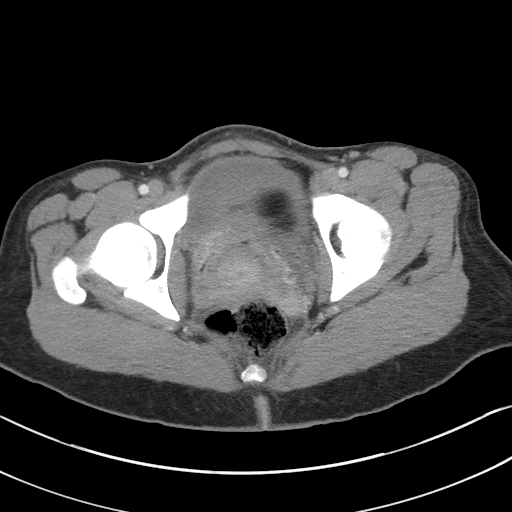
[im 16/82  soft-tissue]
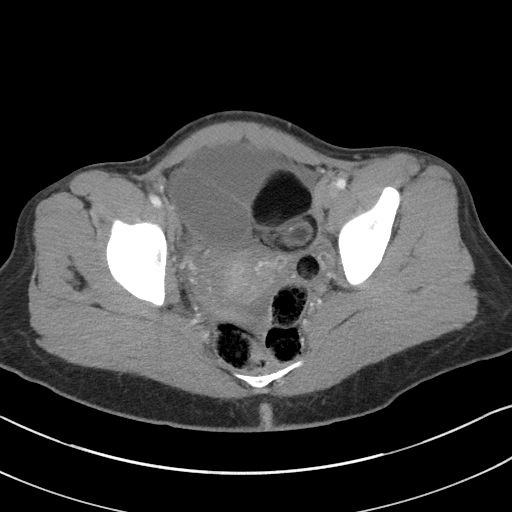
[im 24/82  soft-tissue]
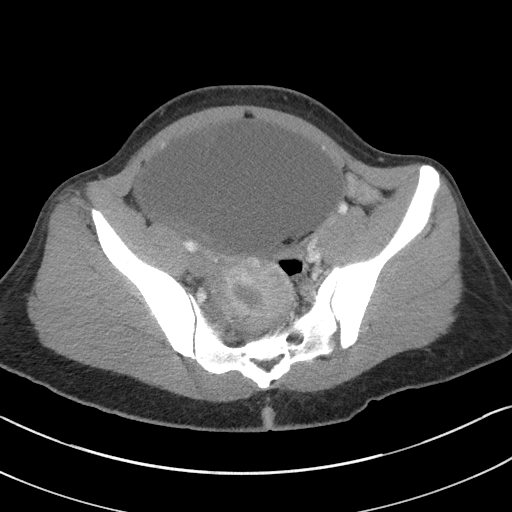
[im 29/82  soft-tissue]
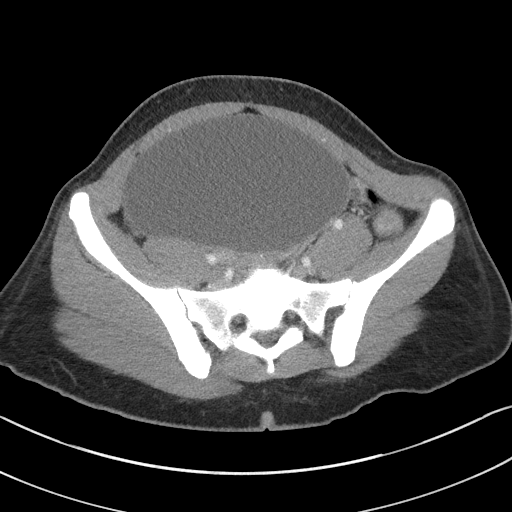
[im 34/82  soft-tissue]
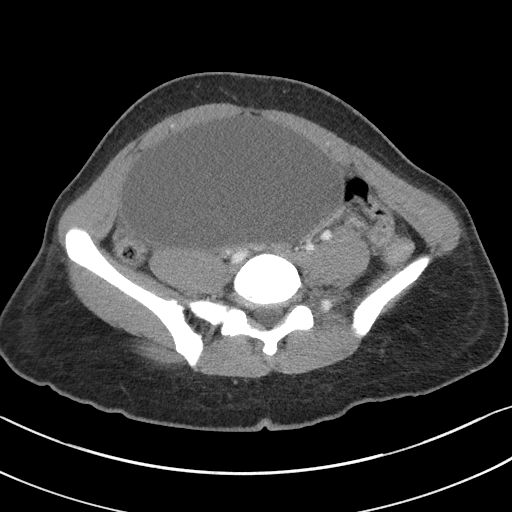
[im 42/82  soft-tissue]
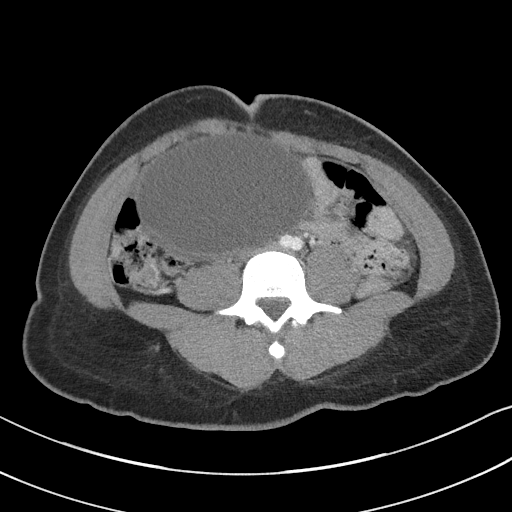
[im 48/82  soft-tissue]
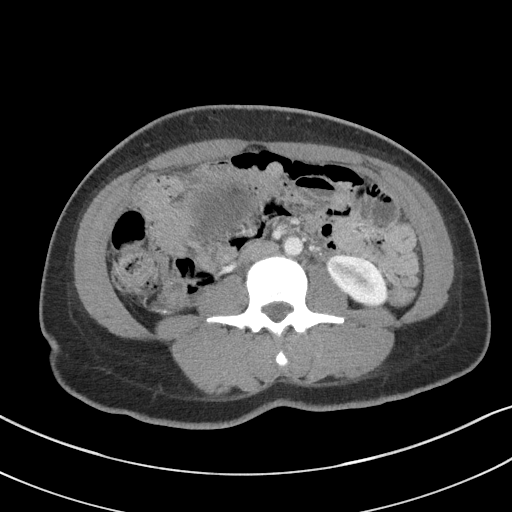
[im 53/82  soft-tissue]
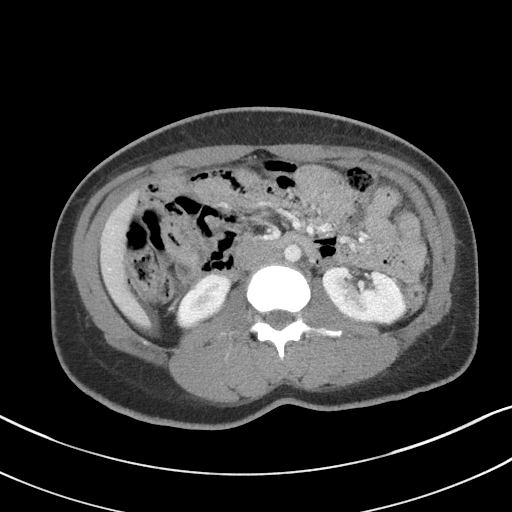
[im 53/82  bone]
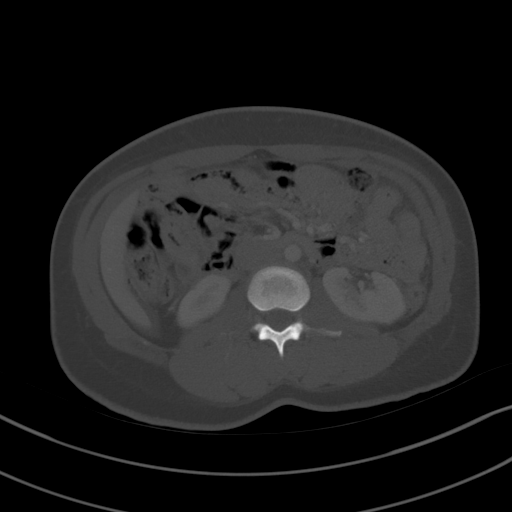
[im 58/82  soft-tissue]
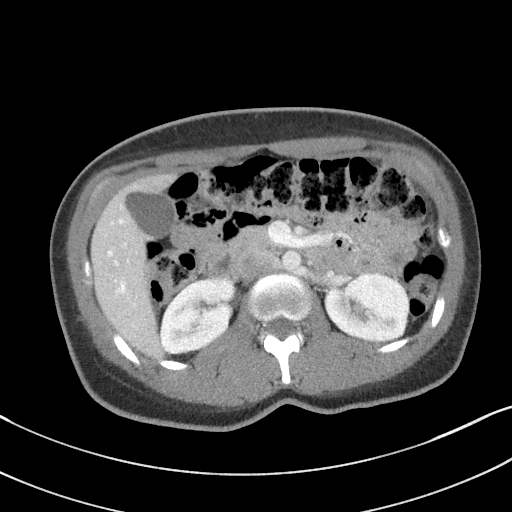
[im 66/82  soft-tissue]
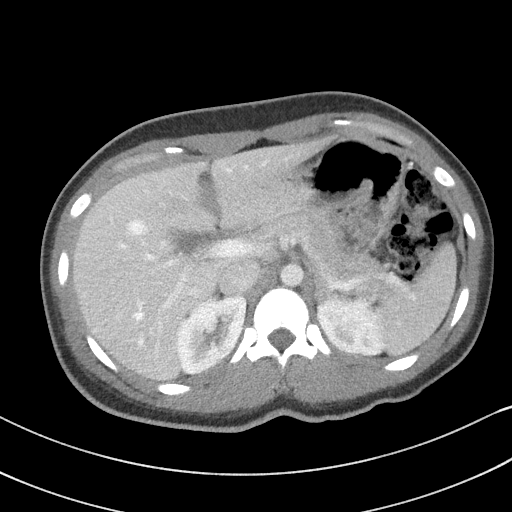
[im 71/82  soft-tissue]
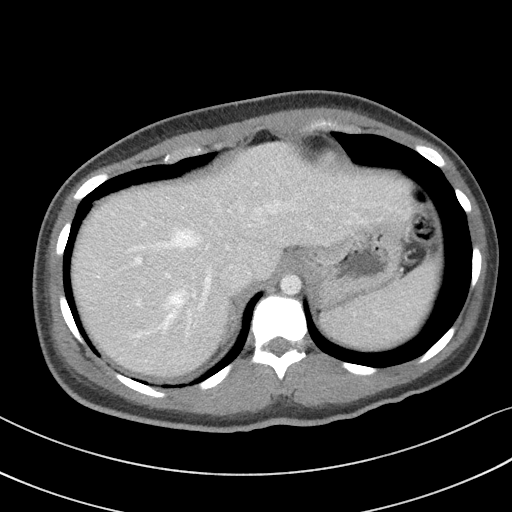
[im 76/82  soft-tissue]
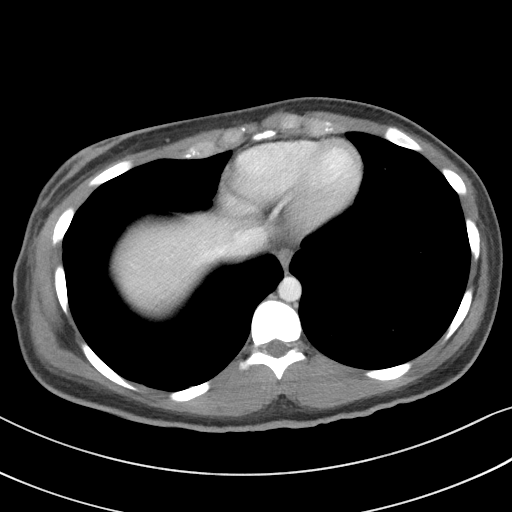

[Series 5: coronal st · coronal · 0.66mm/px · 3 of 78 slices shown]
[im 26/78  soft-tissue]
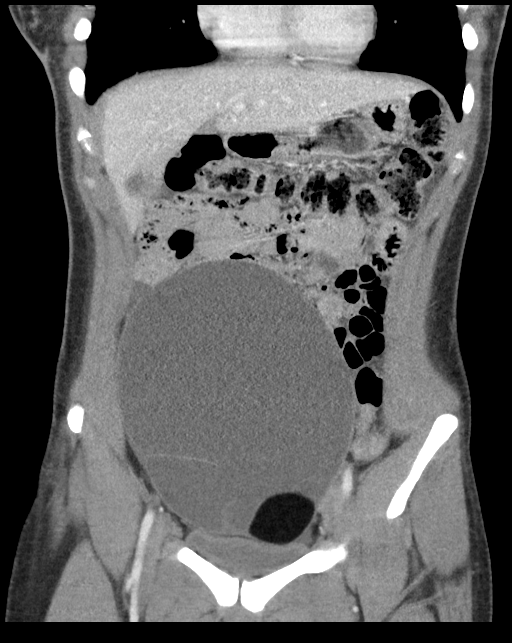
[im 35/78  soft-tissue]
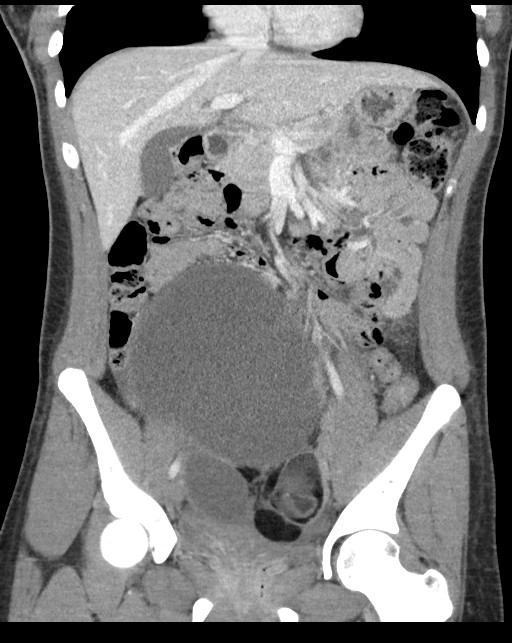
[im 43/78  soft-tissue]
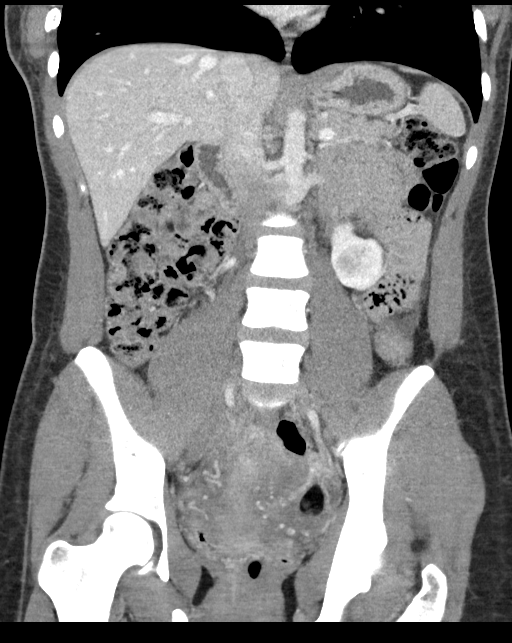

[16 of 46 positions shown; findings below may reference images not displayed]

FINDINGS: Lower chest: Negative.

Hepatobiliary: Negative.

Pancreas: Negative.

Spleen: Negative.

Adrenals/Urinary Tract: Adrenal glands and right kidney are
unremarkable. Left kidney is non rotated. Ureters are decompressed.
Bladder is grossly unremarkable.

Stomach/Bowel: Stomach, small bowel, appendix and colon are
unremarkable.

Vascular/Lymphatic: Retroaortic left renal vein. Vascular structures
are otherwise unremarkable. No pathologically enlarged lymph nodes.

Reproductive: Uterus is visualized. There is a large cystic mass
arising from the anterior central anatomic pelvis, measuring
approximately 9.5 x 15.5 x 19.4 cm, with areas of internal fat and
calcification. Mass measured approximately 10.2 x 4.5 x 6.7 cm on
03/06/2016.

Other: Small pelvic free fluid.  Trace perihepatic ascites.

Musculoskeletal: Negative.
IMPRESSION: 1. Large pelvic mass with fluid, fat and calcified components,
likely increased in size from 03/06/2016 and indicative of an
ovarian dermoid. Given interval increase in size as well as sheer
overall size, surgical consultation is recommended.
2. Small ascites.

## 2020-08-10 ENCOUNTER — Other Ambulatory Visit: Payer: Self-pay

## 2020-08-10 ENCOUNTER — Ambulatory Visit
Admission: EM | Admit: 2020-08-10 | Discharge: 2020-08-10 | Disposition: A | Discharge status: Home / Self Care | Payer: BC Managed Care – PPO | Attending: Family Medicine | Admitting: Family Medicine

## 2020-08-10 DIAGNOSIS — J209 Acute bronchitis, unspecified: Secondary | ICD-10-CM

## 2020-08-10 DIAGNOSIS — Z1152 Encounter for screening for COVID-19: Secondary | ICD-10-CM

## 2020-08-10 DIAGNOSIS — Z20822 Contact with and (suspected) exposure to covid-19: Secondary | ICD-10-CM

## 2020-08-10 MED ORDER — BENZONATATE 100 MG PO CAPS: 100.0000 mg | ORAL_CAPSULE | Freq: Three times a day (TID) | ORAL | 0 refills | Status: DC | PRN

## 2020-08-10 MED ORDER — PREDNISONE 20 MG PO TABS: 40.0000 mg | ORAL_TABLET | Freq: Every day | ORAL | 0 refills | Status: DC

## 2020-08-10 NOTE — ED Provider Notes (Signed)
[facility]    CSN: [id number] Arrival date & time: 08/10/20  1114      History   Chief Complaint Chief Complaint  Patient presents with  . Sore Throat  . Cough    HPI Kim Zimmerman is a 29 y.o. female.   HPI Patient presents today with sore throat, persistent cough, and chest congestion.  Patient denies history of asthma.  No known sick contacts.  Patient is a current daily smoker.  No history of recurrent bronchitis.  She is afebrile on arrival.  Endorses body aches and headache.   Past Medical History:  Diagnosis Date  . Depression   . Dermoid cyst     Patient Active Problem List   Diagnosis Date Noted  . S/P ovarian cystectomy 03/29/2017  . Bilateral dermoid cysts of ovaries 03/14/2017    Past Surgical History:  Procedure Laterality Date  . BREAST REDUCTION SURGERY    . OOPHORECTOMY Right 03/29/2017   Procedure: RIGHT OOPHORECTOMY;  Surgeon: Jonnie Kind, MD;  Location: [facility];  Service: Gynecology;  Laterality: Right;  . OVARIAN CYST REMOVAL Left 03/29/2017   Procedure: LEFT OVARIAN CYSTECTOMY;  Surgeon: Jonnie Kind, MD;  Location: [facility];  Service: Gynecology;  Laterality: Left;    OB History    Gravida  3   Para  3   Term  3   Preterm      AB      Living  3     SAB      IAB      Ectopic      Multiple      Live Births  3            Home Medications    Prior to Admission medications   Medication Sig Start Date End Date Taking? Authorizing Provider  docusate sodium (COLACE) 100 MG capsule Take 1 capsule (100 mg total) by mouth 2 (two) times daily as needed. Patient not taking: Reported on 04/13/2017 03/30/17   Jonnie Kind, MD  ibuprofen (ADVIL,MOTRIN) 600 MG tablet Take 1 tablet (600 mg total) by mouth 4 (four) times daily. 03/30/17   Jonnie Kind, MD  traMADol (ULTRAM) 50 MG tablet Take 1 tablet (50 mg total) by mouth every 6 (six) hours as needed for moderate pain or severe pain. Patient not taking: Reported  on 04/13/2017 03/30/17   Jonnie Kind, MD    Family History Family History  Adopted: Yes    Social History Social History   Tobacco Use  . Smoking status: Former Smoker    Packs/day: 0.00    Years: 3.00    Pack years: 0.00    Types: Cigarettes    Quit date: 09/05/2014    Years since quitting: 5.9  . Smokeless tobacco: Never Used  Vaping Use  . Vaping Use: Former  Substance Use Topics  . Alcohol use: Yes    Comment: very rare  . Drug use: No     Allergies   Hydrocodone   Review of Systems Review of Systems Pertinent negatives listed in HPI   Physical Exam Triage Vital Signs ED Triage Vitals  Enc Vitals Group     BP 08/10/20 1136 113/76     Pulse Rate 08/10/20 1136 94     Resp 08/10/20 1136 18     Temp 08/10/20 1136 98.7 F (37.1 C)     Temp src --      SpO2 08/10/20 1136 99 %  Weight --      Height --      Head Circumference --      Peak Flow --      Pain Score 08/10/20 1134 4     Pain Loc --      Pain Edu? --      Excl. in [place]? --    No data found.  Updated Vital Signs BP 113/76   Pulse 94   Temp 98.7 F (37.1 C)   Resp 18   LMP 07/27/2020 (Approximate)   SpO2 99%   Visual Acuity Right Eye Distance:   Left Eye Distance:   Bilateral Distance:    Right Eye Near:   Left Eye Near:    Bilateral Near:     Physical Exam General appearance: alert, Ill-appearing, no distress Head: Normocephalic, without obvious abnormality, atraumatic ENT: mucosal edema, congestion, oropharynx w/o exudate Respiratory: Respirations even , unlabored, coarse lung sound,no wheezing, no crackles, or no rales Heart: rate and rhythm normal. No gallop or murmurs noted on exam  Abdomen: BS +, no distention, no rebound tenderness, or no mass Extremities: No gross deformities Skin: Skin color, texture, turgor normal. No rashes seen  Psych: Appropriate mood and affect. Neurologic: Mental status: Alert, oriented to person, place, and time, thought content  appropriate.   UC Treatments / Results  Labs (all labs ordered are listed, but only abnormal results are displayed) Labs Reviewed  COVID-19, FLU A+B NAA    EKG   Radiology No results found.  Procedures Procedures (including critical care time)  Medications Ordered in UC Medications - No data to display  Initial Impression / Assessment and Plan / UC Course  I have reviewed the triage vital signs and the nursing notes.  Pertinent labs & imaging results that were available during my care of the patient were reviewed by me and considered in my medical decision making (see chart for details).    Respiratory panel pending. Treating for acute bronchitis per discharge meds below.  Return precautions discussed with patient.  Work note provided. Force fluids.  Ibuprofen Tylenol as needed for body aches and fever Follow-up with primary care symptoms worsen or do not improve. Final Clinical Impressions(s) / UC Diagnoses   Final diagnoses:  Acute bronchitis, unspecified organism  Encounter for laboratory testing for COVID-19 virus     Discharge Instructions     Force fluids.  Start prednisone 40 mg today and take daily over the course of the next 5 days with food.  Benzonatate Perles prescribed for cough take as directed.  Refrain from smoking until course of illness resolves as smoking will only prolong the course of your recovery.  Tylenol and ibuprofen as needed for body aches and fever.    ED Prescriptions    Medication Sig Dispense Auth. Provider   predniSONE (DELTASONE) 20 MG tablet Take 2 tablets (40 mg total) by mouth daily with breakfast. 10 tablet Scot Jun, FNP   benzonatate (TESSALON) 100 MG capsule Take 1-2 capsules (100-200 mg total) by mouth 3 (three) times daily as needed for cough. 40 capsule Scot Jun, FNP     PDMP not reviewed this encounter.   Scot Jun, FNP 08/10/20 1156

## 2020-08-10 NOTE — ED Triage Notes (Signed)
Pt presents  with c/ sore throat and cough for past few days, needs work note

## 2020-08-10 NOTE — Discharge Instructions (Addendum)
Force fluids.  Start prednisone 40 mg today and take daily over the course of the next 5 days with food.  Benzonatate Perles prescribed for cough take as directed.  Refrain from smoking until course of illness resolves as smoking will only prolong the course of your recovery.  Tylenol and ibuprofen as needed for body aches and fever.

## 2020-08-15 LAB — COVID-19, FLU A+B NAA
Influenza A, NAA: NOT DETECTED
Influenza B, NAA: NOT DETECTED
SARS-CoV-2, NAA: NOT DETECTED

## 2021-09-06 ENCOUNTER — Emergency Department (HOSPITAL_COMMUNITY)
Admission: EM | Admit: 2021-09-06 | Discharge: 2021-09-06 | Disposition: A | Discharge status: Home / Self Care | Payer: BC Managed Care – PPO | Attending: Emergency Medicine | Admitting: Emergency Medicine

## 2021-09-06 ENCOUNTER — Other Ambulatory Visit: Payer: Self-pay

## 2021-09-06 ENCOUNTER — Encounter (HOSPITAL_COMMUNITY): Payer: Self-pay | Admitting: *Deleted

## 2021-09-06 DIAGNOSIS — U071 COVID-19: Secondary | ICD-10-CM

## 2021-09-06 MED ORDER — ACETAMINOPHEN 325 MG PO TABS
650.0000 mg | ORAL_TABLET | Freq: Once | ORAL | Status: AC
  Administered 2021-09-06: 650 mg via ORAL
  Filled 2021-09-06: qty 2

## 2021-09-06 MED ORDER — ONDANSETRON HCL 4 MG PO TABS: 4.0000 mg | ORAL_TABLET | Freq: Four times a day (QID) | ORAL | 0 refills | Status: DC

## 2021-09-06 NOTE — ED Triage Notes (Signed)
Pt with Body aches, HA, fever noted in triage.  +chills Positive home covid test today.

## 2021-09-06 NOTE — ED Provider Notes (Signed)
[facility] EMERGENCY DEPARTMENT Provider Note   CSN: [id number] Arrival date & time: 09/06/21  1029     History  Chief Complaint  Patient presents with   Covid    Kim Zimmerman is a 31 y.o. female.  HPI  Patient without significant medical history presents to the emergency department with chief complaint of URI-like symptoms.  Patient states symptoms started yesterday, she endorses fevers, chills, nasal congestion, cough, slight chest tightness, decrease in appetite and general body aches.  Patient denies any pleuritic chest pain, denies any stomach pains, nausea, vomiting, diarrhea, states she still tolerating p.o.  She is not immunocompromise, she is only got 1 dose of her COVID-vaccine, denies any recent sick contacts.  She states she been taking Robitussin without much relief.  She has no other complaints at this time.  Home Medications Prior to Admission medications   Medication Sig Start Date End Date Taking? Authorizing Provider  ondansetron (ZOFRAN) 4 MG tablet Take 1 tablet (4 mg total) by mouth every 6 (six) hours. 09/06/21  Yes Marcello Fennel, PA-C  benzonatate (TESSALON) 100 MG capsule Take 1-2 capsules (100-200 mg total) by mouth 3 (three) times daily as needed for cough. 08/10/20   Scot Jun, FNP  docusate sodium (COLACE) 100 MG capsule Take 1 capsule (100 mg total) by mouth 2 (two) times daily as needed. Patient not taking: Reported on 04/13/2017 03/30/17   Jonnie Kind, MD  ibuprofen (ADVIL,MOTRIN) 600 MG tablet Take 1 tablet (600 mg total) by mouth 4 (four) times daily. 03/30/17   Jonnie Kind, MD  predniSONE (DELTASONE) 20 MG tablet Take 2 tablets (40 mg total) by mouth daily with breakfast. 08/10/20   Scot Jun, FNP  traMADol (ULTRAM) 50 MG tablet Take 1 tablet (50 mg total) by mouth every 6 (six) hours as needed for moderate pain or severe pain. Patient not taking: Reported on 04/13/2017 03/30/17   Jonnie Kind, MD      Allergies     Hydrocodone    Review of Systems   Review of Systems  Constitutional:  Positive for appetite change, chills and fever.  HENT:  Positive for congestion.   Respiratory:  Positive for cough and chest tightness. Negative for shortness of breath.   Cardiovascular:  Negative for chest pain.  Gastrointestinal:  Negative for abdominal pain, diarrhea, nausea and vomiting.  Musculoskeletal:  Positive for myalgias.  Neurological:  Negative for headaches.   Physical Exam Updated Vital Signs BP 105/60 (BP Location: Right Arm)    Pulse 82    Temp (!) 101 F (38.3 C) (Oral)    Resp 20    Ht 5\' 4"  (1.626 m)    Wt 63.5 kg    LMP 08/16/2021    SpO2 100%    BMI 24.03 kg/m  Physical Exam Vitals and nursing note reviewed.  Constitutional:      General: She is not in acute distress.    Appearance: She is not ill-appearing.  HENT:     Head: Normocephalic and atraumatic.     Right Ear: Tympanic membrane, ear canal and external ear normal.     Left Ear: Tympanic membrane, ear canal and external ear normal.     Nose: Congestion present.     Comments: Erythematous turbinates bilaterally    Mouth/Throat:     Mouth: Mucous membranes are moist.     Pharynx: Oropharynx is clear. No oropharyngeal exudate or posterior oropharyngeal erythema.  Eyes:     Conjunctiva/sclera:  Conjunctivae normal.  Cardiovascular:     Rate and Rhythm: Normal rate and regular rhythm.     Pulses: Normal pulses.     Heart sounds: No murmur heard.   No friction rub. No gallop.  Pulmonary:     Effort: No respiratory distress.     Breath sounds: No wheezing, rhonchi or rales.  Abdominal:     Palpations: Abdomen is soft.     Tenderness: There is no abdominal tenderness. There is no right CVA tenderness or left CVA tenderness.  Musculoskeletal:     Right lower leg: No edema.     Left lower leg: No edema.  Skin:    General: Skin is warm and dry.  Neurological:     Mental Status: She is alert.  Psychiatric:        Mood and  Affect: Mood normal.    ED Results / Procedures / Treatments   Labs (all labs ordered are listed, but only abnormal results are displayed) Labs Reviewed - No data to display  EKG None  Radiology No results found.  Procedures Procedures    Medications Ordered in ED Medications  acetaminophen (TYLENOL) tablet 650 mg (650 mg Oral Given 09/06/21 1110)    ED Course/ Medical Decision Making/ A&P                           Medical Decision Making  This patient presents to the ED for concern of COVID, this involves an extensive number of treatment options, and is a complaint that carries with it a high risk of complications and morbidity.  The differential diagnosis includes influenza PE    Additional history obtained:  Additional history obtained from electronic medical record  Co morbidities that complicate the patient evaluation  N/a  Social Determinants of Health:  N/a     Test Considered:  Chest x-ray but will defer as lung sounds are clear bilaterally, it be very atypical for the patient develop pneumonia in less than 24 hours, presentation more consistent with viral URI a i.e. COVID    Rule out Low suspicion for systemic infection as patient is nontoxic-appearing, patient was febrile on arrival but this is likely secondary due to viral infection, she is given Tylenol, she is not taking any pyretics prior to arrival.  I have low suspicion for PE as patient denies pleuritic chest pain, shortness of breath, patient is PERC. low suspicion for strep throat as oropharynx was visualized, no erythema or exudates noted.  Low suspicion patient would need  hospitalized due to viral infection or Covid as vital signs reassuring, patient is not in respiratory distress.      Dispostion and problem list  After consideration of the diagnostic results and the patients response to treatment, I feel that the patent would benefit from   COVID-recommend symptom management, will  defer on antiviral treatment at this time as she is not immunocompromise, has very mild symptoms, low risk for adverse outcome due to COVID.Marland Kitchen             Final Clinical Impression(s) / ED Diagnoses Final diagnoses:  COVID    Rx / DC Orders ED Discharge Orders          Ordered    ondansetron (ZOFRAN) 4 MG tablet  Every 6 hours        09/06/21 1134              Marcello Fennel, [place] 09/06/21 1134  Varney Biles, MD 09/06/21 1539

## 2021-09-06 NOTE — Discharge Instructions (Signed)
You have covid, recommend over-the-counter pain medications like ibuprofen Tylenol for fever and pain control, nasal decongestions like Flonase and Zyrtec, Mucinex for cough.  If not eating recommend supplementing with Gatorade to help with electrolyte supplementation.  Given you prescription for Zofran please use as needed for nausea  Follow-up PCP for further evaluation.  Come back to the emergency department if you develop chest pain, shortness of breath, severe abdominal pain, uncontrolled nausea, vomiting, diarrhea.

## 2021-09-06 NOTE — Addendum Note (Signed)
Encounter addended by: Sophronia Simas, RN on: 09/06/2021 11:41 AM  Actions taken: ED Visit Navigator Disposition section accepted

## 2022-04-28 ENCOUNTER — Other Ambulatory Visit (HOSPITAL_COMMUNITY): Payer: Self-pay | Admitting: Obstetrics & Gynecology

## 2022-04-28 ENCOUNTER — Encounter: Payer: Self-pay | Admitting: Obstetrics & Gynecology

## 2022-04-28 ENCOUNTER — Other Ambulatory Visit (HOSPITAL_COMMUNITY)
Admission: RE | Admit: 2022-04-28 | Discharge: 2022-04-28 | Disposition: A | Discharge status: Home / Self Care | Payer: Managed Care, Other (non HMO) | Source: Ambulatory Visit | Attending: Obstetrics & Gynecology | Admitting: Obstetrics & Gynecology

## 2022-04-28 ENCOUNTER — Ambulatory Visit (INDEPENDENT_AMBULATORY_CARE_PROVIDER_SITE_OTHER): Payer: Managed Care, Other (non HMO) | Admitting: Obstetrics & Gynecology

## 2022-04-28 VITALS — BP 110/60 | HR 82 | Ht 64.0 in | Wt 149.4 lb

## 2022-04-28 DIAGNOSIS — Z01419 Encounter for gynecological examination (general) (routine) without abnormal findings: Secondary | ICD-10-CM | POA: Diagnosis not present

## 2022-04-28 DIAGNOSIS — N6322 Unspecified lump in the left breast, upper inner quadrant: Secondary | ICD-10-CM | POA: Diagnosis not present

## 2022-04-28 DIAGNOSIS — Z113 Encounter for screening for infections with a predominantly sexual mode of transmission: Secondary | ICD-10-CM | POA: Diagnosis not present

## 2022-04-28 DIAGNOSIS — N6320 Unspecified lump in the left breast, unspecified quadrant: Secondary | ICD-10-CM

## 2022-04-28 NOTE — Progress Notes (Signed)
   WELL-WOMAN EXAMINATION Patient name: Kim Zimmerman MRN [id number]  Date of birth: 24-Nov-1990 Chief Complaint:   Gynecologic Exam  History of Present Illness:   Kim Zimmerman is a 31 y.o. [id number] female being seen today for a routine well-woman exam.  Today she notes that she is trying to get pregnant.  Pt has female partner and they have been doing their own IUI.  Patient's last menstrual period was 04/02/2022. Denies issues with her menses- no HMB, significant dysmenorrhea  The current method of family planning is none.    Last pap several years.  Last mammogram: n/a. Last colonoscopy: n/a     04/28/2022    8:37 AM  Depression screen PHQ 2/9  Decreased Interest 0  Down, Depressed, Hopeless 0  PHQ - 2 Score 0  Altered sleeping 0  Tired, decreased energy 1  Change in appetite 0  Feeling bad or failure about yourself  0  Trouble concentrating 0  Moving slowly or fidgety/restless 0  Suicidal thoughts 0  PHQ-9 Score 1      Review of Systems:   Pertinent items are noted in HPI Denies any headaches, blurred vision, fatigue, shortness of breath, chest pain, abdominal pain, bowel movements, urination, or intercourse unless otherwise stated above.  Pertinent History Reviewed:  Reviewed past medical,surgical, social and family history.  Reviewed problem list, medications and allergies. Physical Assessment:   Vitals:   04/28/22 0837  BP: 110/60  Pulse: 82  Weight: 149 lb 6.4 oz (67.8 kg)  Height: '5\' 4"'$  (1.626 m)  Body mass index is 25.64 kg/m.        Physical Examination:   General appearance - well appearing, and in no distress  Mental status - alert, oriented to person, place, and time  Psych:  She has a normal mood and affect  Skin - warm and dry, normal color, no suspicious lesions noted  Chest - effort normal, all lung fields clear to auscultation bilaterally  Heart - normal rate and regular rhythm  Neck:  midline trachea, no thyromegaly or nodules  Breasts -  breasts appear normal, no suspicious masses, no skin or nipple changes or  axillary nodes  Abdomen - soft, nontender, nondistended, no masses or organomegaly  Pelvic - VULVA: normal appearing vulva with no masses, tenderness or lesions  VAGINA: normal appearing vagina with normal color and discharge, no lesions  CERVIX: normal appearing cervix without discharge or lesions, no CMT  Thin prep pap is done with HR HPV cotesting  UTERUS: uterus is felt to be normal size, shape, consistency and nontender   ADNEXA: No adnexal masses or tenderness noted.  Extremities:  No swelling or varicosities noted  Chaperone:  pt declined      Assessment & Plan:  1) Well-Woman Exam -pap collected, reviewed guidelines  2) STI screening -testing to be completed including HIV/Syphilis  3) Left breast mass -plan for imaging, suspect benign finding  4) Family planning -encouraged pt to consider ART -plans to review with her current partner  Orders Placed This Encounter  Procedures   MM Digital Diagnostic Unilat L   RPR   HIV Antibody (routine testing w rflx)    Meds: No orders of the defined types were placed in this encounter.   Follow-up: Return in about 1 year (around 04/29/2023) for Annual.   Janyth Pupa, DO Attending [place], [place] for [facility], [place]

## 2022-04-29 LAB — HIV ANTIBODY (ROUTINE TESTING W REFLEX): HIV Screen 4th Generation wRfx: NONREACTIVE

## 2022-04-29 LAB — RPR: RPR Ser Ql: NONREACTIVE

## 2022-05-05 ENCOUNTER — Ambulatory Visit (HOSPITAL_COMMUNITY)
Admission: RE | Admit: 2022-05-05 | Discharge: 2022-05-05 | Disposition: A | Discharge status: Home / Self Care | Payer: Managed Care, Other (non HMO) | Source: Ambulatory Visit | Attending: Obstetrics & Gynecology | Admitting: Obstetrics & Gynecology

## 2022-05-05 DIAGNOSIS — N6320 Unspecified lump in the left breast, unspecified quadrant: Secondary | ICD-10-CM | POA: Diagnosis present

## 2022-05-05 DIAGNOSIS — N6322 Unspecified lump in the left breast, upper inner quadrant: Secondary | ICD-10-CM | POA: Insufficient documentation

## 2022-05-05 LAB — CYTOLOGY - PAP
Chlamydia: NEGATIVE
Comment: NEGATIVE
Comment: NEGATIVE
Comment: NORMAL
Diagnosis: UNDETERMINED — AB
High risk HPV: NEGATIVE
Neisseria Gonorrhea: NEGATIVE
